# Patient Record
Sex: Female | Born: 1990 | Race: Black or African American | Hispanic: No | Marital: Single | Smoking: Former smoker
Health system: Southern US, Community
[De-identification: ages and names within clinical notes are randomized; demographics above are authoritative.]

## PROBLEM LIST (undated history)

## (undated) DIAGNOSIS — S82109A Unspecified fracture of upper end of unspecified tibia, initial encounter for closed fracture: Secondary | ICD-10-CM

## (undated) DIAGNOSIS — Z3490 Encounter for supervision of normal pregnancy, unspecified, unspecified trimester: Secondary | ICD-10-CM

## (undated) HISTORY — PX: MOUTH SURGERY: SHX715

---
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
## 2012-07-26 ENCOUNTER — Emergency Department (HOSPITAL_COMMUNITY)
Admission: EM | Admit: 2012-07-26 | Discharge: 2012-07-26 | Disposition: A | Discharge status: Home / Self Care | Payer: Medicaid Other | Attending: Emergency Medicine | Admitting: Emergency Medicine

## 2012-07-26 ENCOUNTER — Encounter (HOSPITAL_COMMUNITY): Payer: Self-pay | Admitting: Emergency Medicine

## 2012-07-26 DIAGNOSIS — K047 Periapical abscess without sinus: Secondary | ICD-10-CM

## 2012-07-26 DIAGNOSIS — F17200 Nicotine dependence, unspecified, uncomplicated: Secondary | ICD-10-CM | POA: Insufficient documentation

## 2012-07-26 DIAGNOSIS — K029 Dental caries, unspecified: Secondary | ICD-10-CM | POA: Insufficient documentation

## 2012-07-26 MED ORDER — AMOXICILLIN 500 MG PO CAPS: 500.0000 mg | ORAL_CAPSULE | Freq: Three times a day (TID) | ORAL | Status: DC

## 2012-07-26 MED ORDER — HYDROCODONE-ACETAMINOPHEN 5-325 MG PO TABS
1.0000 | ORAL_TABLET | Freq: Once | ORAL | Status: AC
  Administered 2012-07-26: 1 via ORAL
  Filled 2012-07-26: qty 1

## 2012-07-26 MED ORDER — HYDROCODONE-ACETAMINOPHEN 5-325 MG PO TABS: 1.0000 | ORAL_TABLET | ORAL | Status: AC | PRN

## 2012-07-26 MED ORDER — AMOXICILLIN 250 MG PO CAPS
500.0000 mg | ORAL_CAPSULE | Freq: Once | ORAL | Status: AC
  Administered 2012-07-26: 500 mg via ORAL
  Filled 2012-07-26: qty 2

## 2012-07-26 NOTE — ED Notes (Signed)
Pt c/o dental pain constantly x 2 weeks.

## 2012-07-26 NOTE — ED Provider Notes (Signed)
History     CSN: [id number]  Arrival date & time 07/26/12  1958   First MD Initiated Contact with Patient 07/26/12 2055      Chief Complaint  Patient presents with  . Dental Pain    (Consider location/radiation/quality/duration/timing/severity/associated sxs/prior treatment) HPI Comments: Brianna Maynard is a 22 y.o. Female presenting with a 10 day history of dental pain and gingival swelling.   The patient has a history of a cavity in the tooth involved which has recently started to cause pain.  There has been no fevers,  Chills, nausea or vomiting, also no complaint of difficulty swallowing,  Although chewing makes pain worse.  The patient has tried ibuprofen without relief of symptoms.         The history is provided by the patient.    History reviewed. No pertinent past medical history.  Past Surgical History  Procedure Date  . Mouth surgery     History reviewed. No pertinent family history.  History  Substance Use Topics  . Smoking status: Current Every Day Smoker    Types: Cigarettes  . Smokeless tobacco: Not on file  . Alcohol Use: No    OB History    Grav Para Term Preterm Abortions TAB SAB Ect Mult Living                  Review of Systems  Constitutional: Negative for fever.  HENT: Positive for dental problem. Negative for sore throat, facial swelling, neck pain and neck stiffness.   Respiratory: Negative for shortness of breath.     Allergies  Review of patient's allergies indicates no known allergies.  Home Medications   Current Outpatient Rx  Name  Route  Sig  Dispense  Refill  . IBUPROFEN 200 MG PO TABS   Oral   Take 600 mg by mouth once as needed. For pain         . AMOXICILLIN 500 MG PO CAPS   Oral   Take 1 capsule (500 mg total) by mouth 3 (three) times daily.   30 capsule   0   . HYDROCODONE-ACETAMINOPHEN 5-325 MG PO TABS   Oral   Take 1 tablet by mouth every 4 (four) hours as needed for pain.   12 tablet   0     BP  135/83  Pulse 93  Temp 97.9 F (36.6 C) (Oral)  Resp 18  Ht 5\' 5"  (1.651 m)  Wt 110 lb (49.896 kg)  BMI 18.31 kg/m2  SpO2 100%  LMP 07/23/2012  Physical Exam  Constitutional: She is oriented to person, place, and time. She appears well-developed and well-nourished. No distress.  HENT:  Head: Normocephalic and atraumatic. No trismus in the jaw.  Right Ear: Tympanic membrane and external ear normal.  Left Ear: Tympanic membrane and external ear normal.  Mouth/Throat: Oropharynx is clear and moist and mucous membranes are normal. No oral lesions. Dental abscesses present.    Eyes: Conjunctivae normal are normal.  Neck: Normal range of motion. Neck supple.  Cardiovascular: Normal rate and normal heart sounds.   Pulmonary/Chest: Effort normal.  Abdominal: She exhibits no distension.  Musculoskeletal: Normal range of motion.  Lymphadenopathy:    She has no cervical adenopathy.  Neurological: She is alert and oriented to person, place, and time.  Skin: Skin is warm and dry. No erythema.  Psychiatric: She has a normal mood and affect.    ED Course  Procedures (including critical care time)  Labs Reviewed - No data  to display No results found.   1. Dental abscess       MDM  Pt prescribed Amoxil and hydrocodone.  She will continue taking her ibuprofen.  She was given dental referral numbers, also discussed the dental clinic associated with the health department which she plans to explore.  Patient has no systemic symptoms.  She's not acutely ill.        Burgess Amor, [place] 07/26/12 2128

## 2012-07-27 NOTE — ED Provider Notes (Signed)
Medical screening examination/treatment/procedure(s) were performed by non-physician practitioner and as supervising physician I was immediately available for consultation/collaboration.  Stephen Kohut, MD 07/27/12 1450 

## 2012-11-22 ENCOUNTER — Encounter (HOSPITAL_COMMUNITY): Payer: Self-pay | Admitting: *Deleted

## 2012-11-22 ENCOUNTER — Emergency Department (HOSPITAL_COMMUNITY)
Admission: EM | Admit: 2012-11-22 | Discharge: 2012-11-23 | Disposition: A | Discharge status: Home / Self Care | Payer: Self-pay | Attending: Emergency Medicine | Admitting: Emergency Medicine

## 2012-11-22 DIAGNOSIS — Z8719 Personal history of other diseases of the digestive system: Secondary | ICD-10-CM

## 2012-11-22 DIAGNOSIS — N949 Unspecified condition associated with female genital organs and menstrual cycle: Secondary | ICD-10-CM | POA: Insufficient documentation

## 2012-11-22 DIAGNOSIS — R102 Pelvic and perineal pain: Secondary | ICD-10-CM

## 2012-11-22 DIAGNOSIS — G8929 Other chronic pain: Secondary | ICD-10-CM | POA: Insufficient documentation

## 2012-11-22 DIAGNOSIS — F17200 Nicotine dependence, unspecified, uncomplicated: Secondary | ICD-10-CM | POA: Insufficient documentation

## 2012-11-22 DIAGNOSIS — K5900 Constipation, unspecified: Secondary | ICD-10-CM | POA: Insufficient documentation

## 2012-11-22 DIAGNOSIS — Z3202 Encounter for pregnancy test, result negative: Secondary | ICD-10-CM | POA: Insufficient documentation

## 2012-11-22 DIAGNOSIS — N946 Dysmenorrhea, unspecified: Secondary | ICD-10-CM | POA: Insufficient documentation

## 2012-11-22 LAB — URINALYSIS, ROUTINE W REFLEX MICROSCOPIC
Bilirubin Urine: NEGATIVE
Glucose, UA: NEGATIVE mg/dL
Ketones, ur: NEGATIVE mg/dL
Leukocytes, UA: NEGATIVE
Nitrite: NEGATIVE
Protein, ur: NEGATIVE mg/dL
Specific Gravity, Urine: 1.02 (ref 1.005–1.030)
Urobilinogen, UA: 1 mg/dL (ref 0.0–1.0)
pH: 7 (ref 5.0–8.0)

## 2012-11-22 LAB — URINE MICROSCOPIC-ADD ON

## 2012-11-22 LAB — PREGNANCY, URINE: Preg Test, Ur: NEGATIVE

## 2012-11-22 MED ORDER — NAPROXEN 250 MG PO TABS
500.0000 mg | ORAL_TABLET | Freq: Once | ORAL | Status: AC
  Administered 2012-11-22: 500 mg via ORAL
  Filled 2012-11-22: qty 2

## 2012-11-22 NOTE — ED Provider Notes (Signed)
History     This chart was scribed for Jones Skene, MD, MD by Smitty Pluck, ED Scribe. The patient was seen in room APA18/[facility]18 and the patient's care was started at 1:19 PM.   CSN: [id number]  Arrival date & time 11/22/12  1851      No chief complaint on file.    The history is provided by the patient and medical records. No language interpreter was used.   HPI Comments: Brianna Maynard is a 22 y.o. female who presents to the Emergency Department complaining of intermittent, severe cramping abdominal pain onset 2 weeks ago. She rates the pain at 8/10. Pt reports that she has taken 3 ibuprofen today without relief of pain. She denies aggravation of pain after eating. She mentions having mild constipation.She states she has white vaginal discharge without odor. LNMP was beginning of May. She mentions having swollen area over left eye that has been present for 2 months. Pt states she has tried OTC sty relief ointment without relief.  Pt denies dysuria, urinary urgency, CP, fever, chills, nausea, vomiting, diarrhea, weakness, cough, SOB and any other pain. She denies new sexual partners within last 6 months.    History reviewed. No pertinent past medical history.  Past Surgical History  Procedure Laterality Date  . Mouth surgery      History reviewed. No pertinent family history.  History  Substance Use Topics  . Smoking status: Current Every Day Smoker -- 1.00 packs/day    Types: Cigarettes  . Smokeless tobacco: Not on file  . Alcohol Use: No    OB History   Grav Para Term Preterm Abortions TAB SAB Ect Mult Living                  Review of Systems At least 10pt or greater review of systems completed and are negative except where specified in the HPI.  Allergies  Review of patient's allergies indicates no known allergies.  Home Medications   Current Outpatient Rx  Name  Route  Sig  Dispense  Refill  . ibuprofen (ADVIL,MOTRIN) 200 MG tablet   Oral   Take 600 mg  by mouth every 8 (eight) hours as needed for pain. For pain           BP 126/89  Pulse 82  Temp(Src) 98.4 F (36.9 C) (Oral)  Resp 16  Ht 5\' 5"  (1.651 m)  Wt 116 lb (52.617 kg)  BMI 19.3 kg/m2  SpO2 100%  LMP 10/22/2012  Physical Exam  Nursing note and vitals reviewed.   Nursing notes reviewed.  Electronic medical record reviewed. VITAL SIGNS:   Filed Vitals:   11/22/12 1923 11/23/12 0041  BP: 126/89 119/70  Pulse: 82 71  Temp: 98.4 F (36.9 C)   TempSrc: Oral   Resp: 16 16  Height: 5\' 5"  (1.651 m)   Weight: 116 lb (52.617 kg)   SpO2: 100% 98%   CONSTITUTIONAL: Awake, oriented, appears non-toxic HENT: Atraumatic, normocephalic, oral mucosa pink and moist, airway patent. Nares patent without drainage. External ears normal. EYES: Conjunctiva clear, EOMI, PERRLA. Left lateral eyelid stye NECK: Trachea midline, non-tender, supple CARDIOVASCULAR: Normal heart rate, Normal rhythm, No murmurs, rubs, gallops PULMONARY/CHEST: Clear to auscultation, no rhonchi, wheezes, or rales. Symmetrical breath sounds. Non-tender. ABDOMINAL: Non-distended, soft, non-tender - no rebound or guarding.  BS normal. NEUROLOGIC: Non-focal, moving all four extremities, no gross sensory or motor deficits. EXTREMITIES: No clubbing, cyanosis, or edema SKIN: Warm, Dry, No erythema, No rash   ED  Course  Procedures (including critical care time) DIAGNOSTIC STUDIES: Oxygen Saturation is 100% on room air, normal by my interpretation.    COORDINATION OF CARE: 11:22 PM Discussed ED treatment with pt and pt agrees.  Medications  naproxen (NAPROSYN) tablet 500 mg (not administered)    Labs Reviewed  WET PREP, GENITAL - Abnormal; Notable for the following:    Clue Cells Wet Prep HPF POC FEW (*)    WBC, Wet Prep HPF POC FEW (*)    All other components within normal limits  URINALYSIS, ROUTINE W REFLEX MICROSCOPIC - Abnormal; Notable for the following:    Hgb urine dipstick MODERATE (*)    All  other components within normal limits  URINE MICROSCOPIC-ADD ON - Abnormal; Notable for the following:    Squamous Epithelial / LPF FEW (*)    All other components within normal limits  GC/CHLAMYDIA PROBE AMP  PREGNANCY, URINE   Dg Abd 2 Views  11/23/2012   *RADIOLOGY REPORT*  Clinical Data: Lower abdominal pain.  ABDOMEN - 2 VIEW  Comparison: No priors.  Findings: Gas and stool are seen scattered throughout the colon extending to the level of the distal rectum.  No pathologic distension of small bowel is noted.  No gross evidence of pneumoperitoneum.  IMPRESSION: 1.  Nonobstructive bowel gas pattern. 2.  No pneumoperitoneum.   Original Report Authenticated By: Trudie Reed, M.D.     1. Pelvic pain   2. H/O constipation     Medications  naproxen (NAPROSYN) tablet 500 mg (500 mg Oral Given 11/22/12 2327)     MDM  Brianna Maynard is a 22 y.o. female presents with chronic intermittent abdominal cramping for 2-3 weeks, occasional constipation - pain resolved with naproxen. No evidence or suggestion of acute intra-abdominal or pelvic pathology based on history and physical exam, patient placed on a bowel regimen to help with her constipation. Her pain may be coming from this. Patient also has a stye, she's instructed to place warm compresses on it    I personally performed the services described in this documentation, which was scribed in my presence. The recorded information has been reviewed and is accurate. Jones Skene, M.D.     Jones Skene, MD 11/24/12 [phone]

## 2012-11-22 NOTE — ED Notes (Signed)
Pt reporting pain and swollen area on left eye lid.  Reporting intermittent abdominal cramping for 2-3 weeks.  Reporting occasional constipation.  Denies nausea or vomiting.  Denies any urgency or frequency with urination.

## 2012-11-22 NOTE — ED Notes (Signed)
MD at bedside. 

## 2012-11-23 ENCOUNTER — Emergency Department (HOSPITAL_COMMUNITY): Payer: Self-pay

## 2012-11-23 LAB — WET PREP, GENITAL
Trich, Wet Prep: NONE SEEN
Yeast Wet Prep HPF POC: NONE SEEN

## 2012-11-23 MED ORDER — POLYETHYLENE GLYCOL 3350 17 GM/SCOOP PO POWD: 17.0000 g | Freq: Two times a day (BID) | ORAL | Status: DC

## 2012-11-23 MED ORDER — DOCUSATE SODIUM 100 MG PO CAPS: 100.0000 mg | ORAL_CAPSULE | Freq: Two times a day (BID) | ORAL | Status: DC

## 2012-11-24 LAB — GC/CHLAMYDIA PROBE AMP
CT Probe RNA: NEGATIVE
GC Probe RNA: NEGATIVE

## 2013-01-09 ENCOUNTER — Encounter (HOSPITAL_COMMUNITY): Payer: Self-pay | Admitting: Emergency Medicine

## 2013-01-09 ENCOUNTER — Emergency Department (HOSPITAL_COMMUNITY)
Admission: EM | Admit: 2013-01-09 | Discharge: 2013-01-09 | Disposition: A | Discharge status: Home / Self Care | Payer: Self-pay | Attending: Emergency Medicine | Admitting: Emergency Medicine

## 2013-01-09 DIAGNOSIS — Z9889 Other specified postprocedural states: Secondary | ICD-10-CM | POA: Insufficient documentation

## 2013-01-09 DIAGNOSIS — K0889 Other specified disorders of teeth and supporting structures: Secondary | ICD-10-CM

## 2013-01-09 DIAGNOSIS — K029 Dental caries, unspecified: Secondary | ICD-10-CM | POA: Insufficient documentation

## 2013-01-09 DIAGNOSIS — F17200 Nicotine dependence, unspecified, uncomplicated: Secondary | ICD-10-CM | POA: Insufficient documentation

## 2013-01-09 DIAGNOSIS — K089 Disorder of teeth and supporting structures, unspecified: Secondary | ICD-10-CM | POA: Insufficient documentation

## 2013-01-09 MED ORDER — OXYCODONE-ACETAMINOPHEN 5-325 MG PO TABS
2.0000 | ORAL_TABLET | Freq: Once | ORAL | Status: AC
  Administered 2013-01-09: 2 via ORAL
  Filled 2013-01-09: qty 2

## 2013-01-09 NOTE — ED Notes (Signed)
Patient c/o left lower tooth pain x 1 week.  Patient states she has appointment with oral surgeon tomorrow for removal, but the pain has increased.

## 2013-01-09 NOTE — ED Notes (Signed)
Discharge instructions given and reviewed with patient and her mother.  Mother verbalized understanding to follow up with oral surgeon today for further pain management.  Patient ambulatory; discharged home in good condition.

## 2013-01-09 NOTE — ED Provider Notes (Signed)
   History    CSN: [id number] Arrival date & time 01/09/13  0530  First MD Initiated Contact with Patient 01/09/13 0536     Chief Complaint  Patient presents with  . Dental Pain   Patient is a 22 y.o. female presenting with tooth pain. The history is provided by the patient and a relative.  Dental Pain Location:  Lower Quality:  Dull Severity:  Severe Onset quality:  Gradual Timing:  Constant Progression:  Worsening Worsened by:  Jaw movement Associated symptoms: no fever     PMH - none  Past Surgical History  Procedure Laterality Date  . Mouth surgery     No family history on file. History  Substance Use Topics  . Smoking status: Current Every Day Smoker -- 1.00 packs/day    Types: Cigarettes  . Smokeless tobacco: Not on file  . Alcohol Use: No   OB History   Grav Para Term Preterm Abortions TAB SAB Ect Mult Living                 Review of Systems  Constitutional: Negative for fever.  Gastrointestinal: Negative for vomiting.    Allergies  Review of patient's allergies indicates no known allergies.  Home Medications   Current Outpatient Rx  Name  Route  Sig  Dispense  Refill  . penicillin v potassium (VEETID) 500 MG tablet   Oral   Take 500 mg by mouth 4 (four) times daily.         . traMADol (ULTRAM) 50 MG tablet   Oral   Take 50 mg by mouth every 6 (six) hours as needed for pain.         Marland Kitchen docusate sodium (COLACE) 100 MG capsule   Oral   Take 1 capsule (100 mg total) by mouth every 12 (twelve) hours.   60 capsule   0   . ibuprofen (ADVIL,MOTRIN) 200 MG tablet   Oral   Take 600 mg by mouth every 8 (eight) hours as needed for pain. For pain         . polyethylene glycol powder (GLYCOLAX) powder   Oral   Take 17 g by mouth 2 (two) times daily.   255 g   0    BP 133/98  Pulse 70  Temp(Src) 99.1 F (37.3 C) (Oral)  Resp 18  Ht 5\' 4"  (1.626 m)  Wt 115 lb (52.164 kg)  BMI 19.73 kg/m2  SpO2 100% Physical Exam CONSTITUTIONAL: Well  developed/well nourished HEAD AND FACE: Normocephalic/atraumatic EYES: EOMI ENMT: Mucous membranes moist.  Poor dentition.  No trismus.  No focal abscess noted. Left lower molar decayed and tender NECK: supple no meningeal signs CV: S1/S2 noted, no murmurs/rubs/gallops noted LUNGS: Lungs are clear to auscultation bilaterally, no apparent distress ABDOMEN: soft, nontender, no rebound or guarding NEURO: Pt is awake/alert, moves all extremitiesx4 EXTREMITIES:full ROM SKIN: warm, color normal  ED Course  Procedures (including critical care time) Labs Reviewed - No data to display No results found. 1. Pain, dental     MDM  Nursing notes including past medical history and social history reviewed and considered in documentation  Pt is scheduled for dental extraction tomorrow She is on PCN and tramadol/ibuprofen.  She reports they are not helping pain Two percocet ordered here.   I advised her to call her oral surgeon today for further pain control Pt here with family to take her home  Joya Gaskins, MD 01/09/13 [phone]

## 2013-06-22 DIAGNOSIS — W3400XA Accidental discharge from unspecified firearms or gun, initial encounter: Secondary | ICD-10-CM

## 2013-06-22 HISTORY — DX: Accidental discharge from unspecified firearms or gun, initial encounter: W34.00XA

## 2013-07-02 ENCOUNTER — Inpatient Hospital Stay: Admit: 2013-07-02 | Payer: Medicaid Other | Admitting: Orthopedic Surgery

## 2013-07-02 ENCOUNTER — Emergency Department (HOSPITAL_COMMUNITY): Payer: Medicaid Other

## 2013-07-02 ENCOUNTER — Encounter (HOSPITAL_COMMUNITY): Payer: Self-pay | Admitting: Anesthesiology

## 2013-07-02 ENCOUNTER — Inpatient Hospital Stay (HOSPITAL_COMMUNITY): Payer: Medicaid Other

## 2013-07-02 ENCOUNTER — Inpatient Hospital Stay (HOSPITAL_COMMUNITY)
Admission: EM | Admit: 2013-07-02 | Discharge: 2013-07-05 | DRG: 493 | Disposition: A | Discharge status: Home / Self Care | Payer: Medicaid Other | Attending: General Surgery | Admitting: General Surgery

## 2013-07-02 ENCOUNTER — Encounter (HOSPITAL_COMMUNITY): Payer: Self-pay | Admitting: Emergency Medicine

## 2013-07-02 ENCOUNTER — Other Ambulatory Visit (HOSPITAL_COMMUNITY): Payer: Self-pay

## 2013-07-02 ENCOUNTER — Inpatient Hospital Stay (HOSPITAL_COMMUNITY): Payer: Medicaid Other | Admitting: Anesthesiology

## 2013-07-02 ENCOUNTER — Encounter (HOSPITAL_COMMUNITY): Admission: EM | Disposition: A | Discharge status: Home / Self Care | Payer: Self-pay | Source: Home / Self Care

## 2013-07-02 DIAGNOSIS — S82201A Unspecified fracture of shaft of right tibia, initial encounter for closed fracture: Secondary | ICD-10-CM

## 2013-07-02 DIAGNOSIS — S91009A Unspecified open wound, unspecified ankle, initial encounter: Secondary | ICD-10-CM

## 2013-07-02 DIAGNOSIS — F17200 Nicotine dependence, unspecified, uncomplicated: Secondary | ICD-10-CM | POA: Diagnosis present

## 2013-07-02 DIAGNOSIS — S82109B Unspecified fracture of upper end of unspecified tibia, initial encounter for open fracture type I or II: Principal | ICD-10-CM | POA: Diagnosis present

## 2013-07-02 DIAGNOSIS — IMO0002 Reserved for concepts with insufficient information to code with codable children: Secondary | ICD-10-CM | POA: Diagnosis present

## 2013-07-02 DIAGNOSIS — S32301A Unspecified fracture of right ilium, initial encounter for closed fracture: Secondary | ICD-10-CM | POA: Diagnosis present

## 2013-07-02 DIAGNOSIS — S82101B Unspecified fracture of upper end of right tibia, initial encounter for open fracture type I or II: Secondary | ICD-10-CM

## 2013-07-02 DIAGNOSIS — S31809A Unspecified open wound of unspecified buttock, initial encounter: Secondary | ICD-10-CM

## 2013-07-02 DIAGNOSIS — S82109A Unspecified fracture of upper end of unspecified tibia, initial encounter for closed fracture: Secondary | ICD-10-CM

## 2013-07-02 DIAGNOSIS — W3400XA Accidental discharge from unspecified firearms or gun, initial encounter: Secondary | ICD-10-CM

## 2013-07-02 DIAGNOSIS — Y249XXA Unspecified firearm discharge, undetermined intent, initial encounter: Secondary | ICD-10-CM

## 2013-07-02 DIAGNOSIS — S82209A Unspecified fracture of shaft of unspecified tibia, initial encounter for closed fracture: Secondary | ICD-10-CM

## 2013-07-02 DIAGNOSIS — S31813A Puncture wound without foreign body of right buttock, initial encounter: Secondary | ICD-10-CM

## 2013-07-02 DIAGNOSIS — D62 Acute posthemorrhagic anemia: Secondary | ICD-10-CM | POA: Diagnosis not present

## 2013-07-02 DIAGNOSIS — S81831A Puncture wound without foreign body, right lower leg, initial encounter: Secondary | ICD-10-CM

## 2013-07-02 DIAGNOSIS — S81009A Unspecified open wound, unspecified knee, initial encounter: Secondary | ICD-10-CM

## 2013-07-02 DIAGNOSIS — S81809A Unspecified open wound, unspecified lower leg, initial encounter: Secondary | ICD-10-CM

## 2013-07-02 DIAGNOSIS — S0081XA Abrasion of other part of head, initial encounter: Secondary | ICD-10-CM

## 2013-07-02 DIAGNOSIS — Z1810 Retained metal fragments, unspecified: Secondary | ICD-10-CM

## 2013-07-02 HISTORY — DX: Unspecified fracture of upper end of unspecified tibia, initial encounter for closed fracture: S82.109A

## 2013-07-02 HISTORY — PX: TIBIA IM NAIL INSERTION: SHX2516

## 2013-07-02 HISTORY — PX: FOREIGN BODY REMOVAL: SHX962

## 2013-07-02 LAB — URINALYSIS, ROUTINE W REFLEX MICROSCOPIC
Bilirubin Urine: NEGATIVE
Glucose, UA: NEGATIVE mg/dL
Ketones, ur: NEGATIVE mg/dL
Leukocytes, UA: NEGATIVE
Nitrite: NEGATIVE
Protein, ur: NEGATIVE mg/dL
Specific Gravity, Urine: 1.037 — ABNORMAL HIGH (ref 1.005–1.030)
Urobilinogen, UA: 1 mg/dL (ref 0.0–1.0)
pH: 6 (ref 5.0–8.0)

## 2013-07-02 LAB — BASIC METABOLIC PANEL
BUN: 9 mg/dL (ref 6–23)
CO2: 24 mEq/L (ref 19–32)
Calcium: 8 mg/dL — ABNORMAL LOW (ref 8.4–10.5)
Chloride: 105 mEq/L (ref 96–112)
Creatinine, Ser: 0.52 mg/dL (ref 0.50–1.10)
GFR calc Af Amer: >90 mL/min (ref >90)
GFR calc non Af Amer: >90 mL/min (ref >90)
Glucose, Bld: 110 mg/dL — ABNORMAL HIGH (ref 70–99)
Potassium: 3.5 mEq/L — ABNORMAL LOW (ref 3.7–5.3)
Sodium: 142 mEq/L (ref 137–147)

## 2013-07-02 LAB — CBC WITH DIFFERENTIAL/PLATELET
Basophils Absolute: 0 10*3/uL (ref 0.0–0.1)
Basophils Relative: 0 % (ref 0–1)
Eosinophils Absolute: 0.1 10*3/uL (ref 0.0–0.7)
Eosinophils Relative: 1 % (ref 0–5)
HCT: 33 % — ABNORMAL LOW (ref 36.0–46.0)
Hemoglobin: 11.1 g/dL — ABNORMAL LOW (ref 12.0–15.0)
Lymphocytes Relative: 30 % (ref 12–46)
Lymphs Abs: 3.2 10*3/uL (ref 0.7–4.0)
MCH: 27.8 pg (ref 26.0–34.0)
MCHC: 33.6 g/dL (ref 30.0–36.0)
MCV: 82.7 fL (ref 78.0–100.0)
Monocytes Absolute: 0.4 10*3/uL (ref 0.1–1.0)
Monocytes Relative: 4 % (ref 3–12)
Neutro Abs: 6.8 10*3/uL (ref 1.7–7.7)
Neutrophils Relative %: 64 % (ref 43–77)
Platelets: 177 10*3/uL (ref 150–400)
RBC: 3.99 MIL/uL (ref 3.87–5.11)
RDW: 13.8 % (ref 11.5–15.5)
WBC: 10.5 10*3/uL (ref 4.0–10.5)

## 2013-07-02 LAB — SAMPLE TO BLOOD BANK

## 2013-07-02 LAB — URINE MICROSCOPIC-ADD ON

## 2013-07-02 LAB — PREGNANCY, URINE: Preg Test, Ur: NEGATIVE

## 2013-07-02 SURGERY — INSERTION, INTRAMEDULLARY ROD, TIBIA
Anesthesia: General | Site: Pelvis | Laterality: Right

## 2013-07-02 MED ORDER — NEOSTIGMINE METHYLSULFATE 1 MG/ML IJ SOLN
INTRAMUSCULAR | Status: DC | PRN
  Administered 2013-07-02: 3 mg via INTRAVENOUS

## 2013-07-02 MED ORDER — OXYCODONE HCL 5 MG/5ML PO SOLN: 5.0000 mg | Freq: Once | ORAL | Status: DC | PRN

## 2013-07-02 MED ORDER — DOCUSATE SODIUM 100 MG PO CAPS
100.0000 mg | ORAL_CAPSULE | Freq: Two times a day (BID) | ORAL | Status: DC
  Administered 2013-07-02 – 2013-07-05 (×6): 100 mg via ORAL
  Filled 2013-07-02 (×7): qty 1

## 2013-07-02 MED ORDER — CEFAZOLIN SODIUM 1-5 GM-% IV SOLN
1.0000 g | Freq: Three times a day (TID) | INTRAVENOUS | Status: DC
  Filled 2013-07-02: qty 50

## 2013-07-02 MED ORDER — IOHEXOL 300 MG/ML  SOLN
100.0000 mL | Freq: Once | INTRAMUSCULAR | Status: AC | PRN
  Administered 2013-07-02: 100 mL via INTRAVENOUS

## 2013-07-02 MED ORDER — CEFAZOLIN SODIUM 1-5 GM-% IV SOLN: 1.0000 g | INTRAVENOUS | Status: DC

## 2013-07-02 MED ORDER — SENNA 8.6 MG PO TABS
1.0000 | ORAL_TABLET | Freq: Two times a day (BID) | ORAL | Status: DC
  Administered 2013-07-02 – 2013-07-05 (×6): 8.6 mg via ORAL
  Filled 2013-07-02 (×8): qty 1

## 2013-07-02 MED ORDER — POLYETHYLENE GLYCOL 3350 17 G PO PACK
17.0000 g | PACK | Freq: Every day | ORAL | Status: DC | PRN
  Filled 2013-07-02: qty 1

## 2013-07-02 MED ORDER — PROMETHAZINE HCL 25 MG/ML IJ SOLN: 6.2500 mg | INTRAMUSCULAR | Status: DC | PRN

## 2013-07-02 MED ORDER — BUPIVACAINE HCL (PF) 0.25 % IJ SOLN
INTRAMUSCULAR | Status: DC | PRN
  Administered 2013-07-02: 20 mL

## 2013-07-02 MED ORDER — PANTOPRAZOLE SODIUM 40 MG IV SOLR
40.0000 mg | Freq: Every day | INTRAVENOUS | Status: DC
  Administered 2013-07-02: 40 mg via INTRAVENOUS
  Filled 2013-07-02 (×2): qty 40

## 2013-07-02 MED ORDER — ONDANSETRON HCL 4 MG/2ML IJ SOLN
INTRAMUSCULAR | Status: DC | PRN
  Administered 2013-07-02: 4 mg via INTRAVENOUS

## 2013-07-02 MED ORDER — DIPHENHYDRAMINE HCL 12.5 MG/5ML PO ELIX
12.5000 mg | ORAL_SOLUTION | ORAL | Status: DC | PRN
  Filled 2013-07-02: qty 10

## 2013-07-02 MED ORDER — METHOCARBAMOL 500 MG PO TABS
ORAL_TABLET | ORAL | Status: AC
  Administered 2013-07-02: 500 mg via ORAL
  Filled 2013-07-02: qty 1

## 2013-07-02 MED ORDER — ROCURONIUM BROMIDE 100 MG/10ML IV SOLN
INTRAVENOUS | Status: DC | PRN
  Administered 2013-07-02: 40 mg via INTRAVENOUS

## 2013-07-02 MED ORDER — 0.9 % SODIUM CHLORIDE (POUR BTL) OPTIME
TOPICAL | Status: DC | PRN
  Administered 2013-07-02: 1000 mL

## 2013-07-02 MED ORDER — ARTIFICIAL TEARS OP OINT
TOPICAL_OINTMENT | OPHTHALMIC | Status: DC | PRN
  Administered 2013-07-02: 1 via OPHTHALMIC

## 2013-07-02 MED ORDER — HYDROMORPHONE HCL PF 1 MG/ML IJ SOLN
INTRAMUSCULAR | Status: AC
  Administered 2013-07-02: 0.5 mg via INTRAVENOUS
  Filled 2013-07-02: qty 1

## 2013-07-02 MED ORDER — SUCCINYLCHOLINE CHLORIDE 20 MG/ML IJ SOLN
INTRAMUSCULAR | Status: DC | PRN
  Administered 2013-07-02: 100 mg via INTRAVENOUS

## 2013-07-02 MED ORDER — GLYCOPYRROLATE 0.2 MG/ML IJ SOLN
INTRAMUSCULAR | Status: DC | PRN
  Administered 2013-07-02: 0.4 mg via INTRAVENOUS

## 2013-07-02 MED ORDER — FENTANYL CITRATE 0.05 MG/ML IJ SOLN
INTRAMUSCULAR | Status: DC | PRN
  Administered 2013-07-02: 100 ug via INTRAVENOUS
  Administered 2013-07-02 (×4): 50 ug via INTRAVENOUS

## 2013-07-02 MED ORDER — PROPOFOL 10 MG/ML IV BOLUS
INTRAVENOUS | Status: DC | PRN
  Administered 2013-07-02: 150 mg via INTRAVENOUS

## 2013-07-02 MED ORDER — CEFAZOLIN SODIUM 1-5 GM-% IV SOLN
1.0000 g | Freq: Four times a day (QID) | INTRAVENOUS | Status: AC
  Administered 2013-07-02 – 2013-07-03 (×3): 1 g via INTRAVENOUS
  Filled 2013-07-02 (×4): qty 50

## 2013-07-02 MED ORDER — DEXTROSE 5 % IV SOLN
INTRAVENOUS | Status: DC | PRN
  Administered 2013-07-02: 16:00:00 via INTRAVENOUS

## 2013-07-02 MED ORDER — HYDROMORPHONE HCL PF 1 MG/ML IJ SOLN
0.2500 mg | INTRAMUSCULAR | Status: DC | PRN
Start: 2013-07-02 — End: 2013-07-02
  Administered 2013-07-02 (×4): 0.5 mg via INTRAVENOUS

## 2013-07-02 MED ORDER — METHOCARBAMOL 100 MG/ML IJ SOLN
500.0000 mg | Freq: Four times a day (QID) | INTRAMUSCULAR | Status: DC | PRN
  Filled 2013-07-02: qty 5

## 2013-07-02 MED ORDER — HYDROMORPHONE HCL PF 1 MG/ML IJ SOLN
1.0000 mg | INTRAMUSCULAR | Status: DC | PRN
  Administered 2013-07-02 – 2013-07-03 (×4): 1 mg via INTRAVENOUS
  Filled 2013-07-02 (×3): qty 1

## 2013-07-02 MED ORDER — HYDROMORPHONE HCL PF 1 MG/ML IJ SOLN
1.0000 mg | INTRAMUSCULAR | Status: DC | PRN
  Filled 2013-07-02 (×3): qty 1

## 2013-07-02 MED ORDER — LIDOCAINE HCL (CARDIAC) 20 MG/ML IV SOLN
INTRAVENOUS | Status: DC | PRN
  Administered 2013-07-02: 80 mg via INTRAVENOUS

## 2013-07-02 MED ORDER — OXYCODONE-ACETAMINOPHEN 5-325 MG PO TABS
ORAL_TABLET | ORAL | Status: AC
  Administered 2013-07-02: 2 via ORAL
  Filled 2013-07-02: qty 2

## 2013-07-02 MED ORDER — CEFAZOLIN SODIUM 1-5 GM-% IV SOLN
1.0000 g | Freq: Once | INTRAVENOUS | Status: AC
  Administered 2013-07-02: 1 g via INTRAVENOUS
  Filled 2013-07-02: qty 50

## 2013-07-02 MED ORDER — ZOLPIDEM TARTRATE 5 MG PO TABS: 5.0000 mg | ORAL_TABLET | Freq: Every evening | ORAL | Status: DC | PRN

## 2013-07-02 MED ORDER — POTASSIUM CHLORIDE IN NACL 20-0.9 MEQ/L-% IV SOLN
INTRAVENOUS | Status: DC
  Administered 2013-07-02: 90 mL/h via INTRAVENOUS
  Administered 2013-07-02: 23:00:00 via INTRAVENOUS
  Filled 2013-07-02 (×5): qty 1000

## 2013-07-02 MED ORDER — OXYCODONE-ACETAMINOPHEN 5-325 MG PO TABS
1.0000 | ORAL_TABLET | ORAL | Status: DC | PRN
  Administered 2013-07-02 – 2013-07-03 (×3): 2 via ORAL
  Filled 2013-07-02: qty 2
  Filled 2013-07-02 (×2): qty 1

## 2013-07-02 MED ORDER — ENOXAPARIN SODIUM 40 MG/0.4ML ~~LOC~~ SOLN
40.0000 mg | SUBCUTANEOUS | Status: DC
  Administered 2013-07-03 – 2013-07-04 (×3): 40 mg via SUBCUTANEOUS
  Filled 2013-07-02 (×6): qty 0.4

## 2013-07-02 MED ORDER — ONDANSETRON HCL 4 MG/2ML IJ SOLN: 4.0000 mg | Freq: Four times a day (QID) | INTRAMUSCULAR | Status: DC | PRN

## 2013-07-02 MED ORDER — ONDANSETRON HCL 4 MG PO TABS: 4.0000 mg | ORAL_TABLET | Freq: Four times a day (QID) | ORAL | Status: DC | PRN

## 2013-07-02 MED ORDER — LACTATED RINGERS IV SOLN
INTRAVENOUS | Status: DC | PRN
  Administered 2013-07-02 (×2): via INTRAVENOUS

## 2013-07-02 MED ORDER — METHOCARBAMOL 500 MG PO TABS
500.0000 mg | ORAL_TABLET | Freq: Four times a day (QID) | ORAL | Status: DC | PRN
  Administered 2013-07-02 – 2013-07-05 (×8): 500 mg via ORAL
  Filled 2013-07-02 (×8): qty 1

## 2013-07-02 MED ORDER — PANTOPRAZOLE SODIUM 40 MG PO TBEC: 40.0000 mg | DELAYED_RELEASE_TABLET | Freq: Every day | ORAL | Status: DC

## 2013-07-02 MED ORDER — OXYCODONE HCL 5 MG PO TABS: 5.0000 mg | ORAL_TABLET | Freq: Once | ORAL | Status: DC | PRN

## 2013-07-02 MED ORDER — HYDROMORPHONE HCL PF 1 MG/ML IJ SOLN
INTRAMUSCULAR | Status: AC
  Filled 2013-07-02: qty 1

## 2013-07-02 MED ORDER — CEFAZOLIN SODIUM-DEXTROSE 2-3 GM-% IV SOLR
INTRAVENOUS | Status: AC
  Administered 2013-07-02: 2 g via INTRAVENOUS
  Filled 2013-07-02: qty 50

## 2013-07-02 MED ORDER — HYDROMORPHONE HCL PF 1 MG/ML IJ SOLN
INTRAMUSCULAR | Status: DC | PRN
  Administered 2013-07-02: 1 mg

## 2013-07-02 MED ORDER — HYDROMORPHONE HCL PF 1 MG/ML IJ SOLN
1.0000 mg | Freq: Once | INTRAMUSCULAR | Status: AC
  Administered 2013-07-02: 1 mg via INTRAVENOUS

## 2013-07-02 MED ORDER — BISACODYL 10 MG RE SUPP
10.0000 mg | Freq: Every day | RECTAL | Status: DC | PRN
Start: 2013-07-02 — End: 2013-07-05

## 2013-07-02 MED ORDER — OXYCODONE HCL 5 MG PO TABS: 5.0000 mg | ORAL_TABLET | ORAL | Status: DC | PRN

## 2013-07-02 MED ORDER — MIDAZOLAM HCL 5 MG/5ML IJ SOLN
INTRAMUSCULAR | Status: DC | PRN
  Administered 2013-07-02: 2 mg via INTRAVENOUS

## 2013-07-02 SURGICAL SUPPLY — 66 items
BANDAGE ELASTIC 4 VELCRO ST LF (GAUZE/BANDAGES/DRESSINGS) IMPLANT
BANDAGE ELASTIC 6 VELCRO ST LF (GAUZE/BANDAGES/DRESSINGS) IMPLANT
BANDAGE ESMARK 6X9 LF (GAUZE/BANDAGES/DRESSINGS) ×2 IMPLANT
BANDAGE GAUZE ELAST BULKY 4 IN (GAUZE/BANDAGES/DRESSINGS) IMPLANT
BENZOIN TINCTURE PRP APPL 2/3 (GAUZE/BANDAGES/DRESSINGS) ×3 IMPLANT
BLADE SURG 15 STRL LF DISP TIS (BLADE) ×2 IMPLANT
BLADE SURG 15 STRL SS (BLADE) ×1
BLADE SURG ROTATE 9660 (MISCELLANEOUS) IMPLANT
BNDG COHESIVE 6X5 TAN STRL LF (GAUZE/BANDAGES/DRESSINGS) ×3 IMPLANT
BNDG ESMARK 6X9 LF (GAUZE/BANDAGES/DRESSINGS) ×3
BNDG GAUZE ELAST 4 BULKY (GAUZE/BANDAGES/DRESSINGS) ×3 IMPLANT
BOOTCOVER CLEANROOM LRG (PROTECTIVE WEAR) IMPLANT
CLOTH BEACON ORANGE TIMEOUT ST (SAFETY) IMPLANT
CONT SPEC 4OZ CLIKSEAL STRL BL (MISCELLANEOUS) ×6 IMPLANT
COVER SURGICAL LIGHT HANDLE (MISCELLANEOUS) ×3 IMPLANT
CUFF TOURNIQUET SINGLE 34IN LL (TOURNIQUET CUFF) ×3 IMPLANT
CUFF TOURNIQUET SINGLE 44IN (TOURNIQUET CUFF) IMPLANT
DRAPE C-ARM 42X72 X-RAY (DRAPES) ×3 IMPLANT
DRAPE ORTHO SPLIT 77X108 STRL (DRAPES) ×3
DRAPE PROXIMA HALF (DRAPES) ×3 IMPLANT
DRAPE SURG ORHT 6 SPLT 77X108 (DRAPES) ×6 IMPLANT
DRAPE U-SHAPE 47X51 STRL (DRAPES) ×3 IMPLANT
DRSG ADAPTIC 3X8 NADH LF (GAUZE/BANDAGES/DRESSINGS) ×3 IMPLANT
DRSG MEPILEX BORDER 4X4 (GAUZE/BANDAGES/DRESSINGS) ×3 IMPLANT
DRSG PAD ABDOMINAL 8X10 ST (GAUZE/BANDAGES/DRESSINGS) ×9 IMPLANT
DURAPREP 26ML APPLICATOR (WOUND CARE) ×3 IMPLANT
ELECT REM PT RETURN 9FT ADLT (ELECTROSURGICAL) ×3
ELECTRODE REM PT RTRN 9FT ADLT (ELECTROSURGICAL) ×2 IMPLANT
FACESHIELD LNG OPTICON STERILE (SAFETY) IMPLANT
GLOVE BIOGEL PI ORTHO PRO SZ8 (GLOVE) ×1
GLOVE ORTHO TXT STRL SZ7.5 (GLOVE) ×3 IMPLANT
GLOVE PI ORTHO PRO STRL SZ8 (GLOVE) ×2 IMPLANT
GLOVE SURG ORTHO 8.0 STRL STRW (GLOVE) ×9 IMPLANT
GOWN STRL NON-REIN LRG LVL3 (GOWN DISPOSABLE) ×9 IMPLANT
GUIDEWIRE BALL NOSE 80CM (WIRE) ×3 IMPLANT
IMMOBILIZER KNEE 24 THIGH 36 (MISCELLANEOUS) ×2 IMPLANT
IMMOBILIZER KNEE 24 UNIV (MISCELLANEOUS) ×3
KIT BASIN OR (CUSTOM PROCEDURE TRAY) ×3 IMPLANT
KIT ROOM TURNOVER OR (KITS) ×3 IMPLANT
MANIFOLD NEPTUNE II (INSTRUMENTS) ×3 IMPLANT
NAIL TIBIAL 9MMX34.5CM (Nail) ×3 IMPLANT
NDL SAFETY ECLIPSE 18X1.5 (NEEDLE) ×2 IMPLANT
NEEDLE HYPO 18GX1.5 SHARP (NEEDLE) ×1
NEEDLE HYPO 25GX1X1/2 BEV (NEEDLE) ×3 IMPLANT
NS IRRIG 1000ML POUR BTL (IV SOLUTION) ×3 IMPLANT
PACK GENERAL/GYN (CUSTOM PROCEDURE TRAY) ×3 IMPLANT
PAD ARMBOARD 7.5X6 YLW CONV (MISCELLANEOUS) ×6 IMPLANT
SPONGE GAUZE 4X4 12PLY (GAUZE/BANDAGES/DRESSINGS) ×6 IMPLANT
STAPLER VISISTAT 35W (STAPLE) ×3 IMPLANT
STOCKINETTE IMPERVIOUS LG (DRAPES) ×3 IMPLANT
STRIP CLOSURE SKIN 1/2X4 (GAUZE/BANDAGES/DRESSINGS) ×3 IMPLANT
SUT ETHILON 3 0 PS 1 (SUTURE) ×3 IMPLANT
SUT MNCRL AB 4-0 PS2 18 (SUTURE) ×3 IMPLANT
SUT VIC AB 0 CT1 18XCR BRD 8 (SUTURE) IMPLANT
SUT VIC AB 0 CT1 27 (SUTURE) ×1
SUT VIC AB 0 CT1 27XBRD ANBCTR (SUTURE) ×2 IMPLANT
SUT VIC AB 0 CT1 8-18 (SUTURE)
SUT VIC AB 2-0 CT1 27 (SUTURE) ×1
SUT VIC AB 2-0 CT1 TAPERPNT 27 (SUTURE) ×2 IMPLANT
SUT VIC AB 3-0 SH 8-18 (SUTURE) ×3 IMPLANT
SYR 20CC LL (SYRINGE) ×3 IMPLANT
SYR CONTROL 10ML LL (SYRINGE) ×3 IMPLANT
TOWEL OR 17X24 6PK STRL BLUE (TOWEL DISPOSABLE) ×3 IMPLANT
TOWEL OR 17X26 10 PK STRL BLUE (TOWEL DISPOSABLE) ×3 IMPLANT
TRAY FOLEY CATH 16FRSI W/METER (SET/KITS/TRAYS/PACK) IMPLANT
WATER STERILE IRR 1000ML POUR (IV SOLUTION) ×3 IMPLANT

## 2013-07-02 NOTE — Progress Notes (Signed)
Orthopedic Tech Progress Note Patient Details:  Raechel ChuteSherriel XXXBroadnax 1990/10/30 [id number]  Ortho Devices Type of Ortho Device: Ace wrap;Post (short leg) splint Ortho Device/Splint Location: rle Ortho Device/Splint Interventions: Application Order changed to posterior leg splint br Dr. Lina SayreLandeau   Crawford, Rembert 07/02/2013, 9:05 AM
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
## 2013-07-02 NOTE — ED Notes (Signed)
Called Flow Manager currently waiting for Step Down Beds.

## 2013-07-02 NOTE — ED Notes (Signed)
OR called ready for patient  

## 2013-07-02 NOTE — ED Notes (Signed)
Pt transported to CT scan.

## 2013-07-02 NOTE — Anesthesia Procedure Notes (Signed)
Procedure Name: Intubation Date/Time: 07/02/2013 3:48 PM Performed by: Wray KearnsFOLEY, ROGER A Pre-anesthesia Checklist: Patient identified, Timeout performed, Emergency Drugs available, Suction available and Patient being monitored Patient Re-evaluated:Patient Re-evaluated prior to inductionOxygen Delivery Method: Circle system utilized Preoxygenation: Pre-oxygenation with 100% oxygen Intubation Type: IV induction, Rapid sequence and Cricoid Pressure applied Laryngoscope Size: Mac and 3 Grade View: Grade I Tube type: Oral Tube size: 7.0 mm Number of attempts: 1 Airway Equipment and Method: Stylet Placement Confirmation: ETT inserted through vocal cords under direct vision,  breath sounds checked- equal and bilateral and positive ETCO2 Secured at: 21 cm Tube secured with: Tape Dental Injury: Teeth and Oropharynx as per pre-operative assessment
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
## 2013-07-02 NOTE — Preoperative (Signed)
Beta Blockers   Reason not to administer Beta Blockers:Not Applicable 
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
## 2013-07-02 NOTE — Anesthesia Preprocedure Evaluation (Addendum)
Anesthesia Evaluation  Patient identified by MRN, date of birth, ID band Patient awake    Reviewed: Allergy & Precautions, H&P , NPO status , Patient's Chart, lab work & pertinent test results, reviewed documented beta blocker date and time   Airway Mallampati: I TM Distance: >3 FB     Dental  (+) Teeth Intact and Dental Advisory Given   Pulmonary Current Smoker,  breath sounds clear to auscultation        Cardiovascular Rhythm:Regular Rate:Normal     Neuro/Psych    GI/Hepatic   Endo/Other    Renal/GU      Musculoskeletal   Abdominal   Peds  Hematology   Anesthesia Other Findings   Reproductive/Obstetrics                          Anesthesia Physical Anesthesia Plan  ASA: II and emergent  Anesthesia Plan: General   Post-op Pain Management:    Induction: Intravenous  Airway Management Planned: Oral ETT  Additional Equipment:   Intra-op Plan:   Post-operative Plan: Extubation in OR  Informed Consent: I have reviewed the patients History and Physical, chart, labs and discussed the procedure including the risks, benefits and alternatives for the proposed anesthesia with the patient or authorized representative who has indicated his/her understanding and acceptance.   Dental advisory given  Plan Discussed with: Anesthesiologist and Surgeon  Anesthesia Plan Comments:         Anesthesia Quick Evaluation

## 2013-07-02 NOTE — Anesthesia Postprocedure Evaluation (Signed)
  Anesthesia Post-op Note  Patient: Brianna Maynard  Procedure(s) Performed: Procedure(s): INTRAMEDULLARY (IM) NAIL TIBIAL (Right) REMOVAL FOREIGN BODY PELVIS (Right)  Patient Location: PACU  Anesthesia Type:General  Level of Consciousness: awake  Airway and Oxygen Therapy: Patient Spontanous Breathing  Post-op Pain: mild  Post-op Assessment: Post-op Vital signs reviewed  Post-op Vital Signs: stable  Complications: No apparent anesthesia complications
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
## 2013-07-02 NOTE — ED Notes (Signed)
Pt taken to X-ray following CT

## 2013-07-02 NOTE — ED Notes (Signed)
Attempted x 2 to get patient for CT by Tito Dinehobeka McCoy- Not ready @ [phone]

## 2013-07-02 NOTE — ED Notes (Signed)
Called OR patient is on list to have surgery today. Does not have a schedule at this time.  Patient notified.
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
## 2013-07-03 ENCOUNTER — Encounter (HOSPITAL_COMMUNITY): Payer: Self-pay | Admitting: *Deleted

## 2013-07-03 DIAGNOSIS — D62 Acute posthemorrhagic anemia: Secondary | ICD-10-CM

## 2013-07-03 LAB — CBC
HCT: 25 % — ABNORMAL LOW (ref 36.0–46.0)
Hemoglobin: 8.8 g/dL — ABNORMAL LOW (ref 12.0–15.0)
MCH: 28.3 pg (ref 26.0–34.0)
MCHC: 35.2 g/dL (ref 30.0–36.0)
MCV: 80.4 fL (ref 78.0–100.0)
Platelets: 146 10*3/uL — ABNORMAL LOW (ref 150–400)
RBC: 3.11 MIL/uL — ABNORMAL LOW (ref 3.87–5.11)
RDW: 13.7 % (ref 11.5–15.5)
WBC: 8.4 10*3/uL (ref 4.0–10.5)

## 2013-07-03 LAB — BASIC METABOLIC PANEL
BUN: 4 mg/dL — ABNORMAL LOW (ref 6–23)
CO2: 27 mEq/L (ref 19–32)
Calcium: 8.6 mg/dL (ref 8.4–10.5)
Chloride: 100 mEq/L (ref 96–112)
Creatinine, Ser: 0.54 mg/dL (ref 0.50–1.10)
GFR calc Af Amer: >90 mL/min (ref >90)
GFR calc non Af Amer: >90 mL/min (ref >90)
Glucose, Bld: 142 mg/dL — ABNORMAL HIGH (ref 70–99)
Potassium: 4.1 mEq/L (ref 3.7–5.3)
Sodium: 138 mEq/L (ref 137–147)

## 2013-07-03 LAB — MRSA PCR SCREENING: MRSA by PCR: NEGATIVE

## 2013-07-03 MED ORDER — HYDROMORPHONE HCL 1 MG/ML PO LIQD: 1.0000 mg | ORAL | Status: DC | PRN

## 2013-07-03 MED ORDER — HYDROMORPHONE HCL PF 1 MG/ML IJ SOLN
1.0000 mg | INTRAMUSCULAR | Status: DC | PRN
  Administered 2013-07-03 – 2013-07-05 (×11): 1 mg via INTRAVENOUS
  Filled 2013-07-03 (×12): qty 1

## 2013-07-03 MED ORDER — OXYCODONE HCL 5 MG PO TABS
10.0000 mg | ORAL_TABLET | ORAL | Status: DC | PRN
  Administered 2013-07-03 (×3): 20 mg via ORAL
  Administered 2013-07-03: 10 mg via ORAL
  Administered 2013-07-04 – 2013-07-05 (×7): 20 mg via ORAL
  Filled 2013-07-03 (×12): qty 4

## 2013-07-03 MED ORDER — POLYETHYLENE GLYCOL 3350 17 G PO PACK
17.0000 g | PACK | Freq: Every day | ORAL | Status: DC
  Administered 2013-07-03 – 2013-07-05 (×3): 17 g via ORAL
  Filled 2013-07-03 (×3): qty 1

## 2013-07-03 MED ORDER — HYDROMORPHONE HCL PF 1 MG/ML IJ SOLN
0.5000 mg | INTRAMUSCULAR | Status: DC | PRN
  Administered 2013-07-03 (×2): 0.5 mg via INTRAVENOUS
  Filled 2013-07-03 (×2): qty 1

## 2013-07-03 MED ORDER — OXYCODONE HCL 5 MG PO TABS
20.0000 mg | ORAL_TABLET | Freq: Once | ORAL | Status: AC
  Administered 2013-07-03: 20 mg via ORAL
  Filled 2013-07-03: qty 4
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
## 2013-07-03 NOTE — Progress Notes (Signed)
Pt transferred to 5N via wheelchair with belongings. Jewelry is with [employer] examinerlaw enforcement.
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
## 2013-07-03 NOTE — Progress Notes (Signed)
Up in chair. Mobilize with therapies. Patient examined and I agree with the assessment and plan  Violeta GelinasBurke Thompson, MD, MPH, FACS Pager: [phone]  07/03/2013 11:04 AM
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
## 2013-07-04 LAB — CBC
HCT: 25.4 % — ABNORMAL LOW (ref 36.0–46.0)
Hemoglobin: 9 g/dL — ABNORMAL LOW (ref 12.0–15.0)
MCH: 28.3 pg (ref 26.0–34.0)
MCHC: 35.4 g/dL (ref 30.0–36.0)
MCV: 79.9 fL (ref 78.0–100.0)
Platelets: 148 10*3/uL — ABNORMAL LOW (ref 150–400)
RBC: 3.18 MIL/uL — ABNORMAL LOW (ref 3.87–5.11)
RDW: 14 % (ref 11.5–15.5)
WBC: 10.5 10*3/uL (ref 4.0–10.5)

## 2013-07-04 MED ORDER — TRAMADOL HCL 50 MG PO TABS
100.0000 mg | ORAL_TABLET | Freq: Four times a day (QID) | ORAL | Status: DC
  Administered 2013-07-04 – 2013-07-05 (×6): 100 mg via ORAL
  Filled 2013-07-04 (×7): qty 2

## 2013-07-04 MED ORDER — MORPHINE SULFATE ER 15 MG PO TBCR
15.0000 mg | EXTENDED_RELEASE_TABLET | Freq: Two times a day (BID) | ORAL | Status: DC
  Administered 2013-07-04: 15 mg via ORAL
  Filled 2013-07-04 (×2): qty 1
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
## 2013-07-04 NOTE — Progress Notes (Signed)
Working on pain control. I also, per her request, spoke to her mother on the phone. Patient examined and I agree with the assessment and plan  Violeta GelinasBurke Thompson, MD, MPH, FACS Pager: [phone]  07/04/2013 1:07 PM

## 2013-07-04 NOTE — Progress Notes (Signed)
UR completed.  Michelle Bryson, RN BSN MHA CCM Trauma/Neuro ICU Case Manager [phone]  
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
## 2013-07-05 MED ORDER — TRAMADOL HCL 50 MG PO TABS: 100.0000 mg | ORAL_TABLET | Freq: Four times a day (QID) | ORAL | Status: DC

## 2013-07-05 MED ORDER — OXYCODONE HCL ER 15 MG PO T12A
15.0000 mg | EXTENDED_RELEASE_TABLET | Freq: Two times a day (BID) | ORAL | Status: DC
  Administered 2013-07-05: 15 mg via ORAL
  Filled 2013-07-05: qty 1

## 2013-07-05 MED ORDER — OXYCODONE HCL ER 10 MG PO T12A: EXTENDED_RELEASE_TABLET | ORAL | Status: DC

## 2013-07-05 MED ORDER — METHOCARBAMOL 500 MG PO TABS: 500.0000 mg | ORAL_TABLET | Freq: Four times a day (QID) | ORAL | Status: DC | PRN

## 2013-07-05 MED ORDER — OXYCODONE-ACETAMINOPHEN 10-325 MG PO TABS: 1.0000 | ORAL_TABLET | ORAL | Status: DC | PRN

## 2013-07-05 MED ORDER — HYDROMORPHONE HCL PF 1 MG/ML IJ SOLN
0.5000 mg | INTRAMUSCULAR | Status: DC | PRN
  Administered 2013-07-05: 0.5 mg via INTRAVENOUS
  Filled 2013-07-05: qty 1
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
## 2013-07-05 NOTE — Progress Notes (Signed)
[facility] and explained the process to her. She stated understanding of the process.  Also discussed DME with her. She wished to have a rolling walker in addition to the crutches for home. That was called in to [facility] as it was not with the original DME orders for 3-n-1 and tub bench.  Those will deliver to pt room before discharge.

## 2013-07-05 NOTE — Discharge Instructions (Signed)
Tibial Fracture, Adult You have a fracture (break in bone) of your tibia. This is the large "shin" bone in your lower leg. These fractures are easily diagnosed with x-rays. TREATMENT  You have a simple fracture which usually will heal without disability. It can be treated with simple immobilization. This means the bone can be held with a cast or splint in a favorable position until your caregiver feels it is stable (healed well enough). Then you can begin range of motion exercises to keep your knee and ankle limber (moving well). HOME CARE INSTRUCTIONS   Apply ice to the injury for 15-20 minutes, 03-04 times per day while awake, for 2 days. Put the ice in a plastic bag and place a thin towel between the bag of ice and your cast.  If you have a plaster or fiberglass cast:  Do not try to scratch the skin under the cast using sharp or pointed objects.  Check the skin around the cast every day. You may put lotion on any red or sore areas.  Keep your cast dry and clean.  If you have a plaster splint:  Wear the splint as directed.  You may loosen the elastic around the splint if your toes become numb, tingle, or turn cold or blue.  Do not put pressure on any part of your cast or splint until it is fully hardened.  Your cast or splint can be protected during bathing with a plastic bag. Do not lower the cast or splint into water.  Use crutches as directed.  Only take over-the-counter or prescription medicines for pain, discomfort, or fever as directed by your caregiver.  See your caregiver as directed. It is very important to keep all follow-up referrals and appointments in order to avoid any long-term problems with your leg and ankle including chronic pain, inability to move the ankle normally, failure of the fracture to heal and permanent disability. SEEK IMMEDIATE MEDICAL CARE IF:   Pain is becoming worse rather than better, or if pain is uncontrolled with medications.  You have  increased swelling, pain, or redness in the foot.  You begin to lose feeling in your foot or toes.  You develop a cold or blue foot or toes on the injured side.  You develop severe pain in your injured leg, especially if it is increased with movement of your toes. Document Released: 03/03/2001 Document Revised: 08/31/2011 Document Reviewed: 09/25/2008 [facility] Patient Information 2014 [place], [place].  No driving while taking oxycodone.

## 2013-07-05 NOTE — Progress Notes (Signed)
Orthopedic Tech Progress Note Patient Details:  Brianna Maynard Dec 25, 1990 [id number]  Ortho Devices Type of Ortho Device: Crutches Ortho Device/Splint Location: rle Ortho Device/Splint Interventions: Application   Brianna Maynard, Brianna Maynard 07/05/2013, 10:52 AM
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
## 2013-07-05 NOTE — Progress Notes (Signed)
Orthopedic Tech Progress Note Patient Details:  Raechel ChuteSherriel XXXBroadnax 19-Jul-1990 [id number]  Patient ID: Raechel ChuteSherriel XXXBroadnax, female   DOB: 19-Jul-1990, 23 y.o.   MRN: [id number]   Nikki DomCrawford, Rembert 07/05/2013, 10:52 AM Viewed order from rn order list

## 2013-07-05 NOTE — Progress Notes (Signed)
Patient has been cleared by PT with crutches.  Pain will always be an issue, but seems to be doing well on regimen of OxyContin, Oxy IR, and Muscle relaxant.  Should be able to go home this afternoon.  This patient has been seen and I agree with the findings and treatment plan.  Marta LamasJames O. Gae BonWyatt, III, MD, FACS [phone] (pager) [phone] (direct pager) Trauma Surgeon
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
## 2013-07-05 NOTE — Progress Notes (Signed)
Patient d/c to home, IV removed. Prescriptions given and instructions reviewed. 

## 2013-07-06 DIAGNOSIS — S32301A Unspecified fracture of right ilium, initial encounter for closed fracture: Secondary | ICD-10-CM | POA: Diagnosis present
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
## 2013-07-10 ENCOUNTER — Telehealth (HOSPITAL_COMMUNITY): Payer: Self-pay | Admitting: Emergency Medicine

## 2013-07-10 NOTE — Telephone Encounter (Signed)
Pt called Friday with fevers. She said she ran them on and off over weekend up to 101 but they seem to have resolved now. No drainage from wounds but is having increased pain. She has an appointment with ortho this week and I suggested she keep that but if she needs to be seen sooner or ortho needs us to reevaluate to call us back.

## 2013-07-10 NOTE — Progress Notes (Signed)
Agree with above 

## 2014-01-01 ENCOUNTER — Encounter (HOSPITAL_COMMUNITY): Payer: Self-pay | Admitting: Emergency Medicine

## 2014-01-01 ENCOUNTER — Emergency Department (HOSPITAL_COMMUNITY)
Admission: EM | Admit: 2014-01-01 | Discharge: 2014-01-01 | Disposition: A | Discharge status: Home / Self Care | Payer: MEDICAID | Attending: Emergency Medicine | Admitting: Emergency Medicine

## 2014-01-01 DIAGNOSIS — K089 Disorder of teeth and supporting structures, unspecified: Secondary | ICD-10-CM | POA: Insufficient documentation

## 2014-01-01 DIAGNOSIS — Z9889 Other specified postprocedural states: Secondary | ICD-10-CM | POA: Insufficient documentation

## 2014-01-01 DIAGNOSIS — F17200 Nicotine dependence, unspecified, uncomplicated: Secondary | ICD-10-CM | POA: Insufficient documentation

## 2014-01-01 DIAGNOSIS — S025XXA Fracture of tooth (traumatic), initial encounter for closed fracture: Secondary | ICD-10-CM

## 2014-01-01 DIAGNOSIS — Z8781 Personal history of (healed) traumatic fracture: Secondary | ICD-10-CM | POA: Insufficient documentation

## 2014-01-01 DIAGNOSIS — K0381 Cracked tooth: Secondary | ICD-10-CM | POA: Insufficient documentation

## 2014-01-01 DIAGNOSIS — K0889 Other specified disorders of teeth and supporting structures: Secondary | ICD-10-CM

## 2014-01-01 MED ORDER — AMOXICILLIN 500 MG PO CAPS: 500.0000 mg | ORAL_CAPSULE | Freq: Three times a day (TID) | ORAL | Status: DC

## 2014-01-01 MED ORDER — TRAMADOL HCL 50 MG PO TABS: 50.0000 mg | ORAL_TABLET | Freq: Four times a day (QID) | ORAL | Status: DC | PRN

## 2014-01-01 MED ORDER — NAPROXEN 500 MG PO TABS: 500.0000 mg | ORAL_TABLET | Freq: Two times a day (BID) | ORAL | Status: DC

## 2014-01-01 MED ORDER — OXYCODONE-ACETAMINOPHEN 5-325 MG PO TABS
1.0000 | ORAL_TABLET | Freq: Once | ORAL | Status: AC
  Administered 2014-01-01: 1 via ORAL
  Filled 2014-01-01: qty 1

## 2014-01-01 MED ORDER — IBUPROFEN 400 MG PO TABS
600.0000 mg | ORAL_TABLET | Freq: Once | ORAL | Status: AC
  Administered 2014-01-01: 600 mg via ORAL
  Filled 2014-01-01: qty 2

## 2014-01-01 MED ORDER — AMOXICILLIN 250 MG PO CAPS
500.0000 mg | ORAL_CAPSULE | Freq: Once | ORAL | Status: AC
  Administered 2014-01-01: 500 mg via ORAL
  Filled 2014-01-01: qty 2

## 2014-01-01 NOTE — ED Notes (Signed)
Patient complaining of bottom right dental pain starting today.

## 2014-01-01 NOTE — ED Notes (Signed)
Had tooth pulled last year the one beside of that has been chipping some without pain, yesterday start having pain, and 2 hours ago got much worse. On lower right

## 2014-01-01 NOTE — ED Provider Notes (Signed)
CSN: [id number]     Arrival date & time 01/01/14  2153 History   First MD Initiated Contact with Patient 01/01/14 2155     Chief Complaint  Patient presents with  . Dental Pain     (Consider location/radiation/quality/duration/timing/severity/associated sxs/prior Treatment) Patient is a 23 y.o. female presenting with tooth pain. The history is provided by the patient.  Dental Pain Location:  Lower Lower teeth location:  31/RL 2nd molar Quality:  Throbbing Onset quality:  Gradual Duration:  24 hours Timing:  Constant Progression:  Worsening Chronicity:  New Context: enamel fracture   Relieved by:  None tried Worsened by:  Cold food/drink, touching and pressure Ineffective treatments:  None tried Associated symptoms: facial pain    Brianna Maynard is a 23 y.o. female who presents to the ED with dental pain that started today on the right lower side. She states that last year she had the tooth beside it extracted. A few days after that she noted that the tooth was chipped but it never gave her a problem. It has continued to break off and then yesterday it started to hurt. Tonight the pain has been severe. She has taken nothing for pain.   Past Medical History  Diagnosis Date  . Closed fracture of proximal tibia 07/02/2013   Past Surgical History  Procedure Laterality Date  . Mouth surgery    . Tibia im nail insertion Right 07/02/2013    Procedure: INTRAMEDULLARY (IM) NAIL TIBIAL;  Surgeon: Eulas PostJoshua P Landau, MD;  Location: [facility] OR;  Service: Orthopedics;  Laterality: Right;  . Foreign body removal Right 07/02/2013    Procedure: REMOVAL FOREIGN BODY PELVIS;  Surgeon: Eulas PostJoshua P Landau, MD;  Location: [facility] OR;  Service: Orthopedics;  Laterality: Right;   History reviewed. No pertinent family history. History  Substance Use Topics  . Smoking status: Current Every Day Smoker -- 1.00 packs/day    Types: Cigarettes  . Smokeless tobacco: Not on file  . Alcohol Use: No   OB History   Grav Para Term Preterm Abortions TAB SAB Ect Mult Living   3              Review of Systems Negative except as stated in HPI   Allergies  Review of patient's allergies indicates no known allergies.  Home Medications   Prior to Admission medications   Medication Sig Start Date End Date Taking? Authorizing Provider  methocarbamol (ROBAXIN) 500 MG tablet Take 1 tablet (500 mg total) by mouth every 6 (six) hours as needed for muscle spasms. 07/05/13   Freeman CaldronMichael J. Jeffery, PA-C  OxyCODONE (OXYCONTIN) 10 mg T12A 12 hr tablet 20mg  po q12 x7d then 10mg  po q12h x7d 07/05/13   Freeman CaldronMichael J. Jeffery, PA-C  oxyCODONE-acetaminophen (PERCOCET) 10-325 MG per tablet Take 1-2 tablets by mouth every 4 (four) hours as needed for pain. 07/05/13   Freeman CaldronMichael J. Jeffery, PA-C  traMADol (ULTRAM) 50 MG tablet Take 2 tablets (100 mg total) by mouth every 6 (six) hours. 07/05/13   Freeman CaldronMichael J. Jeffery, PA-C   BP 119/72  Pulse 84  Temp(Src) 98 F (36.7 C) (Oral)  Resp 24  Ht 5\' 5"  (1.651 m)  Wt 114 lb (51.71 kg)  BMI 18.97 kg/m2  SpO2 100%  LMP 12/11/2013 Physical Exam  Nursing note and vitals reviewed. Constitutional: She is oriented to person, place, and time. She appears well-developed and well-nourished.  HENT:  Head: Normocephalic.  Mouth/Throat: Uvula is midline, oropharynx is clear and moist and mucous membranes  are normal.    Lower right second molar broken.   Eyes: Conjunctivae and EOM are normal.  Neck: Neck supple.  Cardiovascular: Normal rate and regular rhythm.   Pulmonary/Chest: Effort normal. She has no wheezes. She has no rales.  Musculoskeletal: Normal range of motion.  Neurological: She is alert and oriented to person, place, and time. No cranial nerve deficit.  Skin: Skin is warm and dry.  Psychiatric: She has a normal mood and affect. Her behavior is normal.    ED Course  Procedures (including critical care time) Labs Review  MDM  23 y.o. female with dental pain and broken right  second molar. Stable for discharge without fever or other problems. Will treat for pain and infection. She has made an appointment with a dentist for follow up. Discussed with the patient and all questioned fully answered. She will return if any problems arise.    Medication List    TAKE these medications       amoxicillin 500 MG capsule  Commonly known as:  AMOXIL  Take 1 capsule (500 mg total) by mouth 3 (three) times daily.     naproxen 500 MG tablet  Commonly known as:  NAPROSYN  Take 1 tablet (500 mg total) by mouth 2 (two) times daily.      ASK your doctor about these medications       methocarbamol 500 MG tablet  Commonly known as:  ROBAXIN  Take 1 tablet (500 mg total) by mouth every 6 (six) hours as needed for muscle spasms.     OxyCODONE 10 mg T12a 12 hr tablet  Commonly known as:  OXYCONTIN  20mg  po q12 x7d then 10mg  po q12h x7d     oxyCODONE-acetaminophen 10-325 MG per tablet  Commonly known as:  PERCOCET  Take 1-2 tablets by mouth every 4 (four) hours as needed for pain.     traMADol 50 MG tablet  Commonly known as:  ULTRAM  Take 2 tablets (100 mg total) by mouth every 6 (six) hours.  Ask about: Which instructions should I use?     traMADol 50 MG tablet  Commonly known as:  ULTRAM  Take 1 tablet (50 mg total) by mouth every 6 (six) hours as needed.  Ask about: Which instructions should I use?          Brianna Maynard, [place] 01/01/14 2222

## 2014-01-02 NOTE — ED Provider Notes (Signed)
Medical screening examination/treatment/procedure(s) were performed by non-physician practitioner and as supervising physician I was immediately available for consultation/collaboration.   EKG Interpretation None        Kathleen M McManus, DO 01/02/14 2351 

## 2014-04-23 ENCOUNTER — Encounter (HOSPITAL_COMMUNITY): Payer: Self-pay | Admitting: Emergency Medicine

## 2015-01-15 ENCOUNTER — Encounter (HOSPITAL_COMMUNITY): Payer: Self-pay

## 2015-01-15 ENCOUNTER — Emergency Department (HOSPITAL_COMMUNITY)
Admission: EM | Admit: 2015-01-15 | Discharge: 2015-01-15 | Disposition: A | Discharge status: Home / Self Care | Payer: Medicaid Other | Attending: Emergency Medicine | Admitting: Emergency Medicine

## 2015-01-15 DIAGNOSIS — K029 Dental caries, unspecified: Secondary | ICD-10-CM | POA: Insufficient documentation

## 2015-01-15 DIAGNOSIS — Z791 Long term (current) use of non-steroidal anti-inflammatories (NSAID): Secondary | ICD-10-CM | POA: Insufficient documentation

## 2015-01-15 DIAGNOSIS — Z792 Long term (current) use of antibiotics: Secondary | ICD-10-CM | POA: Insufficient documentation

## 2015-01-15 DIAGNOSIS — Z720 Tobacco use: Secondary | ICD-10-CM | POA: Insufficient documentation

## 2015-01-15 DIAGNOSIS — Z8781 Personal history of (healed) traumatic fracture: Secondary | ICD-10-CM | POA: Insufficient documentation

## 2015-01-15 MED ORDER — TRAMADOL HCL 50 MG PO TABS: 50.0000 mg | ORAL_TABLET | Freq: Four times a day (QID) | ORAL | Status: DC | PRN

## 2015-01-15 NOTE — ED Notes (Signed)
Pt c/o toothache since Thursday.

## 2015-01-15 NOTE — Discharge Instructions (Signed)

## 2015-01-15 NOTE — ED Notes (Signed)
Discharge papers given -- Pt also provided with various information concerning Dental care and other services in [facility] . Pt also informed of potential free dental [place] - Stated that she will call to determine if this available

## 2015-01-15 NOTE — ED Provider Notes (Signed)
CSN: [id number]     Arrival date & time 01/15/15  1201 History   First MD Initiated Contact with Patient 01/15/15 1203     Chief Complaint  Patient presents with  . Dental Pain     (Consider location/radiation/quality/duration/timing/severity/associated sxs/prior Treatment) Patient is a 24 y.o. female presenting with tooth pain. The history is provided by the patient.  Dental Pain Location:  Lower Lower teeth location:  18/LL 2nd molar Quality:  Shooting and throbbing Severity:  Moderate Onset quality:  Gradual Duration:  5 days Timing:  Constant Progression:  Worsening Chronicity:  New Context: dental caries and dental fracture   Prior workup: None. Relieved by:  Nothing Worsened by:  Cold food/drink Ineffective treatments:  NSAIDs Associated symptoms: no difficulty swallowing, no drooling, no facial pain, no facial swelling, no fever, no gum swelling, no neck pain, no neck swelling, no oral bleeding, no oral lesions and no trismus     Past Medical History  Diagnosis Date  . Closed fracture of proximal tibia 07/02/2013   Past Surgical History  Procedure Laterality Date  . Mouth surgery    . Tibia im nail insertion Right 07/02/2013    Procedure: INTRAMEDULLARY (IM) NAIL TIBIAL;  Surgeon: Eulas Post, MD;  Location: [facility] OR;  Service: Orthopedics;  Laterality: Right;  . Foreign body removal Right 07/02/2013    Procedure: REMOVAL FOREIGN BODY PELVIS;  Surgeon: Eulas Post, MD;  Location: [facility] OR;  Service: Orthopedics;  Laterality: Right;   No family history on file. History  Substance Use Topics  . Smoking status: Current Every Day Smoker -- 1.00 packs/day    Types: Cigarettes  . Smokeless tobacco: Not on file  . Alcohol Use: No   OB History    Gravida Para Term Preterm AB TAB SAB Ectopic Multiple Living   3              Review of Systems  Constitutional: Negative for fever.  HENT: Positive for dental problem. Negative for drooling, facial swelling, mouth sores  and sore throat.   Respiratory: Negative for shortness of breath.   Musculoskeletal: Negative for neck pain and neck stiffness.      Allergies  Review of patient's allergies indicates no known allergies.  Home Medications   Prior to Admission medications   Medication Sig Start Date End Date Taking? Authorizing Provider  amoxicillin (AMOXIL) 500 MG capsule Take 1 capsule (500 mg total) by mouth 3 (three) times daily. Patient not taking: Reported on 01/15/2015 01/01/14   [name], NP  methocarbamol (ROBAXIN) 500 MG tablet Take 1 tablet (500 mg total) by mouth every 6 (six) hours as needed for muscle spasms. Patient not taking: Reported on 01/15/2015 07/05/13   Freeman Caldron, PA-C  naproxen (NAPROSYN) 500 MG tablet Take 1 tablet (500 mg total) by mouth 2 (two) times daily. Patient not taking: Reported on 01/15/2015 01/01/14   [name], NP  OxyCODONE (OXYCONTIN) 10 mg T12A 12 hr tablet  po q12 x7d then  po q12h x7d Patient not taking: Reported on 01/15/2015 07/05/13   Freeman Caldron, PA-C  oxyCODONE-acetaminophen (PERCOCET) 10-325 MG per tablet Take 1-2 tablets by mouth every 4 (four) hours as needed for pain. Patient not taking: Reported on 01/15/2015 07/05/13   Freeman Caldron, PA-C  traMADol (ULTRAM) 50 MG tablet Take 1 tablet (50 mg total) by mouth every 6 (six) hours as needed. 01/15/15   Burgess Amor, PA-C   BP 139/89 mmHg  Pulse  93  Temp(Src) 98.2 F (36.8 C) (Oral)  Resp 20  Ht 5\' 5"  (1.651 m)  Wt 116 lb (52.617 kg)  BMI 19.30 kg/m2  SpO2 100%  LMP 01/14/2015 Physical Exam  Constitutional: She is oriented to person, place, and time. She appears well-developed and well-nourished. No distress.  HENT:  Head: Normocephalic and atraumatic.  Right Ear: Tympanic membrane and external ear normal.  Left Ear: Tympanic membrane and external ear normal.  Mouth/Throat: Oropharynx is clear and moist and mucous membranes are normal. No oral lesions. No trismus in the jaw.  Dental caries present. No dental abscesses.    Eyes: Conjunctivae are normal.  Neck: Normal range of motion. Neck supple.  Cardiovascular: Normal rate and normal heart sounds.   Pulmonary/Chest: Effort normal.  Musculoskeletal: Normal range of motion.  Lymphadenopathy:    She has no cervical adenopathy.  Neurological: She is alert and oriented to person, place, and time.  Skin: Skin is warm and dry. No erythema.  Psychiatric: She has a normal mood and affect.    ED Course  Procedures (including critical care time) Labs Review Labs Reviewed - No data to display  Imaging Review No results found.   EKG Interpretation None      MDM   Final diagnoses:  Dental cavity    Patient was given dental referrals today.  She is prescribed tramadol.  She was encouraged to consider using dental putty which she could pick up at any drugstore for temporary relief of her nerve sensitivity pain.  When necessary follow-up anticipated.  No sign of infection or abscess today.    Burgess Amor, PA-C 01/15/15 1345  [name], MD 01/15/15 [phone]
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
## 2016-06-16 ENCOUNTER — Encounter (HOSPITAL_COMMUNITY): Payer: Self-pay | Admitting: Emergency Medicine

## 2016-06-16 ENCOUNTER — Emergency Department (HOSPITAL_COMMUNITY): Payer: Medicaid Other

## 2016-06-16 DIAGNOSIS — S99912A Unspecified injury of left ankle, initial encounter: Secondary | ICD-10-CM | POA: Diagnosis present

## 2016-06-16 DIAGNOSIS — Z79899 Other long term (current) drug therapy: Secondary | ICD-10-CM | POA: Insufficient documentation

## 2016-06-16 DIAGNOSIS — F17210 Nicotine dependence, cigarettes, uncomplicated: Secondary | ICD-10-CM | POA: Insufficient documentation

## 2016-06-16 DIAGNOSIS — S93402A Sprain of unspecified ligament of left ankle, initial encounter: Secondary | ICD-10-CM | POA: Diagnosis not present

## 2016-06-16 DIAGNOSIS — W1839XA Other fall on same level, initial encounter: Secondary | ICD-10-CM | POA: Diagnosis not present

## 2016-06-16 DIAGNOSIS — Y929 Unspecified place or not applicable: Secondary | ICD-10-CM | POA: Insufficient documentation

## 2016-06-16 DIAGNOSIS — Y939 Activity, unspecified: Secondary | ICD-10-CM | POA: Diagnosis not present

## 2016-06-16 DIAGNOSIS — Y999 Unspecified external cause status: Secondary | ICD-10-CM | POA: Insufficient documentation
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
## 2016-06-17 ENCOUNTER — Emergency Department (HOSPITAL_COMMUNITY)
Admission: EM | Admit: 2016-06-17 | Discharge: 2016-06-17 | Disposition: A | Discharge status: Home / Self Care | Payer: Medicaid Other | Attending: Emergency Medicine | Admitting: Emergency Medicine

## 2016-06-17 DIAGNOSIS — S93402A Sprain of unspecified ligament of left ankle, initial encounter: Secondary | ICD-10-CM

## 2016-06-17 MED ORDER — IBUPROFEN 400 MG PO TABS
600.0000 mg | ORAL_TABLET | Freq: Once | ORAL | Status: AC
  Administered 2016-06-17: 600 mg via ORAL
  Filled 2016-06-17: qty 2

## 2016-06-17 MED ORDER — ACETAMINOPHEN 325 MG PO TABS
650.0000 mg | ORAL_TABLET | Freq: Once | ORAL | Status: AC
  Administered 2016-06-17: 650 mg via ORAL
  Filled 2016-06-17: qty 2
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
## 2018-03-17 ENCOUNTER — Encounter (HOSPITAL_COMMUNITY): Payer: Self-pay | Admitting: Emergency Medicine

## 2018-03-17 ENCOUNTER — Other Ambulatory Visit: Payer: Self-pay

## 2018-03-17 ENCOUNTER — Emergency Department (HOSPITAL_COMMUNITY)
Admission: EM | Admit: 2018-03-17 | Discharge: 2018-03-17 | Disposition: A | Discharge status: Home / Self Care | Payer: Self-pay | Attending: Emergency Medicine | Admitting: Emergency Medicine

## 2018-03-17 DIAGNOSIS — R1110 Vomiting, unspecified: Secondary | ICD-10-CM | POA: Insufficient documentation

## 2018-03-17 DIAGNOSIS — R109 Unspecified abdominal pain: Secondary | ICD-10-CM | POA: Insufficient documentation

## 2018-03-17 DIAGNOSIS — Z5321 Procedure and treatment not carried out due to patient leaving prior to being seen by health care provider: Secondary | ICD-10-CM | POA: Insufficient documentation

## 2018-03-17 LAB — CBC
HCT: 39.1 % (ref 36.0–46.0)
Hemoglobin: 12.7 g/dL (ref 12.0–15.0)
MCH: 27.4 pg (ref 26.0–34.0)
MCHC: 32.5 g/dL (ref 30.0–36.0)
MCV: 84.4 fL (ref 78.0–100.0)
Platelets: 236 10*3/uL (ref 150–400)
RBC: 4.63 MIL/uL (ref 3.87–5.11)
RDW: 13.7 % (ref 11.5–15.5)
WBC: 8.2 10*3/uL (ref 4.0–10.5)

## 2018-03-17 LAB — COMPREHENSIVE METABOLIC PANEL
ALT: 70 U/L — ABNORMAL HIGH (ref 0–44)
AST: 42 U/L — ABNORMAL HIGH (ref 15–41)
Albumin: 4.5 g/dL (ref 3.5–5.0)
Alkaline Phosphatase: 59 U/L (ref 38–126)
Anion gap: 11 (ref 5–15)
BUN: 11 mg/dL (ref 6–20)
CO2: 24 mmol/L (ref 22–32)
Calcium: 9.2 mg/dL (ref 8.9–10.3)
Chloride: 102 mmol/L (ref 98–111)
Creatinine, Ser: 0.47 mg/dL (ref 0.44–1.00)
GFR calc Af Amer: >60 mL/min (ref >60)
GFR calc non Af Amer: >60 mL/min (ref >60)
Glucose, Bld: 119 mg/dL — ABNORMAL HIGH (ref 70–99)
Potassium: 3.5 mmol/L (ref 3.5–5.1)
Sodium: 137 mmol/L (ref 135–145)
Total Bilirubin: 0.6 mg/dL (ref 0.3–1.2)
Total Protein: 8.1 g/dL (ref 6.5–8.1)

## 2018-03-17 LAB — LIPASE, BLOOD: Lipase: 21 U/L (ref 11–51)

## 2018-03-17 MED ORDER — ONDANSETRON 4 MG PO TBDP
4.0000 mg | ORAL_TABLET | Freq: Once | ORAL | Status: AC | PRN
  Administered 2018-03-17: 4 mg via ORAL
  Filled 2018-03-17: qty 1

## 2018-03-17 NOTE — ED Triage Notes (Signed)
Pt reports sudden onset of abd pain and V/. Currently V/ when called for triage. Denies sick contacts, D/, or fever.

## 2018-03-17 NOTE — ED Notes (Signed)
Pt no longer in waiting room. Urine specimen cup with pt's label and BP cuff in lobby.

## 2018-03-17 NOTE — ED Notes (Signed)
Pt called X 2 with no answer. 

## 2018-08-06 ENCOUNTER — Other Ambulatory Visit: Payer: Self-pay

## 2018-08-06 ENCOUNTER — Encounter (HOSPITAL_COMMUNITY): Payer: Self-pay

## 2018-08-06 ENCOUNTER — Emergency Department (HOSPITAL_COMMUNITY)
Admission: EM | Admit: 2018-08-06 | Discharge: 2018-08-06 | Disposition: A | Discharge status: Home / Self Care | Payer: Medicaid Other | Attending: Emergency Medicine | Admitting: Emergency Medicine

## 2018-08-06 DIAGNOSIS — F17210 Nicotine dependence, cigarettes, uncomplicated: Secondary | ICD-10-CM | POA: Insufficient documentation

## 2018-08-06 DIAGNOSIS — O99322 Drug use complicating pregnancy, second trimester: Secondary | ICD-10-CM | POA: Insufficient documentation

## 2018-08-06 DIAGNOSIS — F1990 Other psychoactive substance use, unspecified, uncomplicated: Secondary | ICD-10-CM

## 2018-08-06 DIAGNOSIS — Z3A19 19 weeks gestation of pregnancy: Secondary | ICD-10-CM | POA: Diagnosis not present

## 2018-08-06 DIAGNOSIS — Z3492 Encounter for supervision of normal pregnancy, unspecified, second trimester: Secondary | ICD-10-CM

## 2018-08-06 HISTORY — DX: Encounter for supervision of normal pregnancy, unspecified, unspecified trimester: Z34.90

## 2018-08-06 MED ORDER — ONDANSETRON 4 MG PO TBDP: 4.0000 mg | ORAL_TABLET | Freq: Four times a day (QID) | ORAL | 0 refills | Status: AC | PRN

## 2018-08-06 MED ORDER — ACETAMINOPHEN 325 MG PO TABS: 650.0000 mg | ORAL_TABLET | Freq: Four times a day (QID) | ORAL | 0 refills | Status: AC | PRN

## 2018-08-06 MED ORDER — HYDROXYZINE HCL 25 MG PO TABS: 25.0000 mg | ORAL_TABLET | Freq: Four times a day (QID) | ORAL | 0 refills | Status: AC

## 2018-08-06 NOTE — Discharge Instructions (Addendum)
If you experience symptoms of abdominal cramping and muscle aches, nausea, vomiting, please use the regimen below as prescribed.  You may take Zofran dissolved under the tongue every 6 hours needed for nausea and vomiting. 2.   You may take Tylenol, 650 mg every 6 hours as needed for abdominal cramping. 3.    You may take hydroxyzine 25 mg every 6 hours as needed for itching and sensation of anxiety.   Please go to the emergency department if you develop any nausea or vomiting that prevents you from keeping anything down despite the nausea medicine.
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
## 2018-08-06 NOTE — ED Notes (Signed)
Patient asleep on stretcher in no distress
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
## 2020-06-22 NOTE — L&D Delivery Note (Signed)
OB/GYN Faculty Practice Delivery Note  Brianna Maynard is a 30 y.o. [id number] s/p SVD at [redacted]w[redacted]d. She was admitted for active labor and SROM.   ROM: rupture date, rupture time, delivery date, or delivery time have not been documented with clear fluid GBS Status: negative Maximum Maternal Temperature: pending (precipitous delivery)  Labor Progress: Patient presented via EMS completely dilated and SROMed.   Delivery Date/Time: [phone] on 05/13/2021 Delivery: Called to room and patient was complete and pushing. Head delivered ROA. No nuchal cord present. Shoulder and body delivered in usual fashion. Infant with spontaneous cry, placed on mother's abdomen, dried and stimulated. Cord clamped x 2 after 1-minute delay, and cut by patient. Cord blood drawn. Placenta delivered spontaneously with gentle cord traction. Fundus firm with massage and Pitocin. Labia, perineum, vagina, and cervix inspected and found to have a shallow periurethral laceration on the right that was hemostatic and therefore not repaired.   Placenta: intact, 3V cord, L&D Complications: None Lacerations: Right periurethral EBL: 25cc Analgesia: None  Infant: female  APGARs 7,8  weight pending  Warner Mccreedy, MD, MPH OB Fellow, Faculty Practice Center for [facility], [facility] 05/13/2021, 5:13 AM

## 2020-10-08 ENCOUNTER — Other Ambulatory Visit: Payer: Self-pay

## 2020-10-08 ENCOUNTER — Emergency Department (HOSPITAL_COMMUNITY): Payer: Medicaid Other

## 2020-10-08 ENCOUNTER — Emergency Department (HOSPITAL_COMMUNITY)
Admission: EM | Admit: 2020-10-08 | Discharge: 2020-10-08 | Disposition: A | Discharge status: Home / Self Care | Payer: Medicaid Other | Attending: Emergency Medicine | Admitting: Emergency Medicine

## 2020-10-08 ENCOUNTER — Encounter (HOSPITAL_COMMUNITY): Payer: Self-pay | Admitting: *Deleted

## 2020-10-08 DIAGNOSIS — R112 Nausea with vomiting, unspecified: Secondary | ICD-10-CM

## 2020-10-08 DIAGNOSIS — R197 Diarrhea, unspecified: Secondary | ICD-10-CM | POA: Insufficient documentation

## 2020-10-08 DIAGNOSIS — O219 Vomiting of pregnancy, unspecified: Secondary | ICD-10-CM | POA: Insufficient documentation

## 2020-10-08 DIAGNOSIS — Z3A01 Less than 8 weeks gestation of pregnancy: Secondary | ICD-10-CM | POA: Diagnosis not present

## 2020-10-08 DIAGNOSIS — F17210 Nicotine dependence, cigarettes, uncomplicated: Secondary | ICD-10-CM | POA: Diagnosis not present

## 2020-10-08 DIAGNOSIS — R1084 Generalized abdominal pain: Secondary | ICD-10-CM | POA: Insufficient documentation

## 2020-10-08 LAB — LIPASE, BLOOD: Lipase: 16 U/L (ref 11–51)

## 2020-10-08 LAB — URINALYSIS, ROUTINE W REFLEX MICROSCOPIC
Bacteria, UA: NONE SEEN
Bilirubin Urine: NEGATIVE
Glucose, UA: NEGATIVE mg/dL
Hgb urine dipstick: NEGATIVE
Ketones, ur: 80 mg/dL — AB
Leukocytes,Ua: NEGATIVE
Nitrite: NEGATIVE
Protein, ur: 100 mg/dL — AB
Specific Gravity, Urine: 1.011 (ref 1.005–1.030)
pH: 7 (ref 5.0–8.0)

## 2020-10-08 LAB — CBC WITH DIFFERENTIAL/PLATELET
Abs Immature Granulocytes: 0.02 10*3/uL (ref 0.00–0.07)
Basophils Absolute: 0 10*3/uL (ref 0.0–0.1)
Basophils Relative: 0 %
Eosinophils Absolute: 0 10*3/uL (ref 0.0–0.5)
Eosinophils Relative: 0 %
HCT: 40.6 % (ref 36.0–46.0)
Hemoglobin: 13.2 g/dL (ref 12.0–15.0)
Immature Granulocytes: 0 %
Lymphocytes Relative: 15 %
Lymphs Abs: 1.2 10*3/uL (ref 0.7–4.0)
MCH: 27.6 pg (ref 26.0–34.0)
MCHC: 32.5 g/dL (ref 30.0–36.0)
MCV: 84.9 fL (ref 80.0–100.0)
Monocytes Absolute: 0.1 10*3/uL (ref 0.1–1.0)
Monocytes Relative: 2 %
Neutro Abs: 6.5 10*3/uL (ref 1.7–7.7)
Neutrophils Relative %: 83 %
Platelets: 290 10*3/uL (ref 150–400)
RBC: 4.78 MIL/uL (ref 3.87–5.11)
RDW: 13.2 % (ref 11.5–15.5)
WBC: 7.9 10*3/uL (ref 4.0–10.5)
nRBC: 0 % (ref 0.0–0.2)

## 2020-10-08 LAB — COMPREHENSIVE METABOLIC PANEL
ALT: 82 U/L — ABNORMAL HIGH (ref 0–44)
AST: 41 U/L (ref 15–41)
Albumin: 4.6 g/dL (ref 3.5–5.0)
Alkaline Phosphatase: 66 U/L (ref 38–126)
Anion gap: 14 (ref 5–15)
BUN: 6 mg/dL (ref 6–20)
CO2: 21 mmol/L — ABNORMAL LOW (ref 22–32)
Calcium: 9.6 mg/dL (ref 8.9–10.3)
Chloride: 100 mmol/L (ref 98–111)
Creatinine, Ser: 0.5 mg/dL (ref 0.44–1.00)
GFR, Estimated: >60 mL/min (ref >60)
Glucose, Bld: 100 mg/dL — ABNORMAL HIGH (ref 70–99)
Potassium: 3.6 mmol/L (ref 3.5–5.1)
Sodium: 135 mmol/L (ref 135–145)
Total Bilirubin: 0.3 mg/dL (ref 0.3–1.2)
Total Protein: 8.8 g/dL — ABNORMAL HIGH (ref 6.5–8.1)

## 2020-10-08 LAB — HCG, QUANTITATIVE, PREGNANCY: hCG, Beta Chain, Quant, S: 52703 m[IU]/mL — ABNORMAL HIGH (ref <5)

## 2020-10-08 MED ORDER — MORPHINE SULFATE (PF) 4 MG/ML IV SOLN
4.0000 mg | Freq: Once | INTRAVENOUS | Status: AC
  Administered 2020-10-08: 4 mg via INTRAVENOUS
  Filled 2020-10-08: qty 1

## 2020-10-08 MED ORDER — ONDANSETRON 4 MG PO TBDP: 4.0000 mg | ORAL_TABLET | Freq: Three times a day (TID) | ORAL | 0 refills | Status: DC | PRN

## 2020-10-08 MED ORDER — SODIUM CHLORIDE 0.9 % IV BOLUS
1000.0000 mL | Freq: Once | INTRAVENOUS | Status: AC
  Administered 2020-10-08: 1000 mL via INTRAVENOUS
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
## 2020-10-08 NOTE — Discharge Instructions (Signed)
Please pick up medication and take as needed for nausea. Drink plenty of fluids to stay hydrated. We have tested you for COVID today. We will call you if you test positive.   Follow up with your OBGYN regarding your positive pregnancy test today and for repeat ultrasound in 1-2 weeks.   Return to the ED for any worsening symptoms

## 2020-10-08 NOTE — ED Triage Notes (Signed)
Vomiting onset last night with burning in epigastric area

## 2020-10-08 NOTE — ED Notes (Signed)
Water provided to the patient.

## 2021-04-30 ENCOUNTER — Ambulatory Visit (INDEPENDENT_AMBULATORY_CARE_PROVIDER_SITE_OTHER): Payer: Medicaid Other | Admitting: Advanced Practice Midwife

## 2021-04-30 ENCOUNTER — Other Ambulatory Visit: Payer: Self-pay

## 2021-04-30 ENCOUNTER — Other Ambulatory Visit (HOSPITAL_COMMUNITY)
Admission: RE | Admit: 2021-04-30 | Discharge: 2021-04-30 | Disposition: A | Discharge status: Home / Self Care | Payer: Medicaid Other | Source: Ambulatory Visit | Attending: Advanced Practice Midwife | Admitting: Advanced Practice Midwife

## 2021-04-30 ENCOUNTER — Encounter: Payer: Self-pay | Admitting: Advanced Practice Midwife

## 2021-04-30 VITALS — BP 124/64 | HR 79 | Wt 150.0 lb

## 2021-04-30 DIAGNOSIS — Z302 Encounter for sterilization: Secondary | ICD-10-CM

## 2021-04-30 DIAGNOSIS — Z8751 Personal history of pre-term labor: Secondary | ICD-10-CM

## 2021-04-30 DIAGNOSIS — O0933 Supervision of pregnancy with insufficient antenatal care, third trimester: Secondary | ICD-10-CM | POA: Diagnosis not present

## 2021-04-30 DIAGNOSIS — F1510 Other stimulant abuse, uncomplicated: Secondary | ICD-10-CM | POA: Insufficient documentation

## 2021-04-30 DIAGNOSIS — Z3480 Encounter for supervision of other normal pregnancy, unspecified trimester: Secondary | ICD-10-CM | POA: Insufficient documentation

## 2021-04-30 DIAGNOSIS — Z3A35 35 weeks gestation of pregnancy: Secondary | ICD-10-CM | POA: Insufficient documentation

## 2021-04-30 DIAGNOSIS — Z23 Encounter for immunization: Secondary | ICD-10-CM | POA: Diagnosis not present

## 2021-04-30 DIAGNOSIS — O0930 Supervision of pregnancy with insufficient antenatal care, unspecified trimester: Secondary | ICD-10-CM | POA: Insufficient documentation

## 2021-04-30 DIAGNOSIS — Z363 Encounter for antenatal screening for malformations: Secondary | ICD-10-CM

## 2021-04-30 DIAGNOSIS — Z3490 Encounter for supervision of normal pregnancy, unspecified, unspecified trimester: Secondary | ICD-10-CM | POA: Insufficient documentation

## 2021-04-30 DIAGNOSIS — F1410 Cocaine abuse, uncomplicated: Secondary | ICD-10-CM | POA: Insufficient documentation

## 2021-04-30 DIAGNOSIS — N926 Irregular menstruation, unspecified: Secondary | ICD-10-CM | POA: Diagnosis not present

## 2021-04-30 LAB — POCT URINALYSIS DIPSTICK OB
Blood, UA: NEGATIVE
Glucose, UA: NEGATIVE
Ketones, UA: NEGATIVE
Leukocytes, UA: NEGATIVE
Nitrite, UA: NEGATIVE

## 2021-04-30 NOTE — Progress Notes (Signed)
INITIAL OBSTETRICAL VISIT Patient name: Brianna Maynard MRN [id number]  Date of birth: Dec 30, 1990 Chief Complaint:   Initial Prenatal Visit  History of Present Illness:   Brianna Maynard is a 30 y.o. [id number] African-American female at [redacted]w[redacted]d by [place] at 6.5 weeks with an Estimated Date of Delivery: 05/29/21 being seen today for her initial obstetrical visit.   Patient's last menstrual period was 09/11/2020. Her obstetrical history is significant for  PTB x 3 (34-36wks); had 6wk scan at [facility] and then reports issues getting insurance coverage and also incarceration that kept her from initiating [facility]; states her older 3 children are with her mother in [place]  .   Today she reports pelvic pressure.  Last pap 08/11/20. Results were: negative per pt report at [facility]  Depression screen [facility] 2/9 04/30/2021  Decreased Interest 0  Down, Depressed, Hopeless 0  PHQ - 2 Score 0  Altered sleeping 0  Tired, decreased energy 0  Change in appetite 0  Feeling bad or failure about yourself  0  Trouble concentrating 0  Moving slowly or fidgety/restless 0  Suicidal thoughts 0  PHQ-9 Score 0    No flowsheet data found.   Review of Systems:   Pertinent items are noted in HPI Denies cramping/contractions, leakage of fluid, vaginal bleeding, abnormal vaginal discharge w/ itching/odor/irritation, headaches, visual changes, shortness of breath, chest pain, abdominal pain, severe nausea/vomiting, or problems with urination or bowel movements unless otherwise stated above.  Pertinent History Reviewed:  Reviewed past medical,surgical, social, obstetrical and family history.  Reviewed problem list, medications and allergies. OB History  Gravida Para Term Preterm AB Living  6 3 0 3 1 3   SAB IAB Ectopic Multiple Live Births    1          # Outcome Date GA Lbr Len/2nd Weight Sex Delivery Anes PTL Lv  6 Current           5 Preterm 10/27/09 [redacted]w[redacted]d   M Vag-Spont     4 Preterm 11/28/08 [redacted]w[redacted]d    F Vag-Spont     3 Preterm 07/17/06 [redacted]w[redacted]d   M Vag-Spont  Y   2 Gravida           1 IAB            Physical Assessment:   Vitals:   04/30/21 1550  BP: 124/64  Pulse: 79  Weight: 150 lb (68 kg)  Body mass index is 25.75 kg/m.       Physical Examination:  General appearance - well appearing, and in no distress  Mental status - alert, oriented to person, place, and time  Psych:  She has a normal mood and affect  Skin - warm and dry, normal color, no suspicious lesions noted  Chest - effort normal, all lung fields clear to auscultation bilaterally  Heart - normal rate and regular rhythm  Abdomen - soft, nontender; FH 35cm, FHR 128  Extremities:  No swelling or varicosities noted  Pelvic - VULVA: normal appearing vulva with no masses, tenderness or lesions  VAGINA: normal appearing vagina with normal color and discharge, no lesions  CERVIX: 1/thick/vtx -2  Thin prep pap is not done   Chaperone: 13/09/22    TODAY'S NT too late  Results for orders placed or performed in visit on 04/30/21 (from the past 24 hour(s))  POC Urinalysis Dipstick OB   Collection Time: 04/30/21  4:14 PM  Result Value Ref Range   Color, UA  Clarity, UA     Glucose, UA Negative Negative   Bilirubin, UA     Ketones, UA neg    Spec Grav, UA     Blood, UA neg    pH, UA     POC,PROTEIN,UA Small (1+) Negative, Trace, Small (1+), Moderate (2+), Large (3+), 4+   Urobilinogen, UA     Nitrite, UA neg    Leukocytes, UA Negative Negative   Appearance     Odor      Assessment & Plan:  1) Low-Risk Pregnancy [id number] at [redacted]w[redacted]d with an Estimated Date of Delivery: 05/29/21   2) Initial OB visit with late onset to care @ 35.6wks  3) Hx PTB x 3 (34-36wks)  4) Hx cocaine/meth use  5) Desires ppBTL> will sign 30d papers today; she understands if she delivers prior to 37wks but after 72h, this can be done inpatient; otherwise will plan for outpatient BTL  Meds: No orders of the defined types were placed  in this encounter.   Initial labs obtained Continue prenatal vitamins Reviewed n/v relief measures and warning s/s to report Reviewed recommended weight gain based on pre-gravid BMI Encouraged well-balanced diet Genetic & carrier screening discussed: too late for [facility], NT/IT, and [facility] , too late for [employer]  Ultrasound discussed; fetal survey: requested CCNC completed> form faxed if has or is planning to apply for medicaid The nature of [employer] for [employer] with multiple MDs and other Advanced Practice Providers was explained to patient; also emphasized that fellows, residents, and students are part of our team. Does not have home bp cuff. Office bp cuff given: yes. Rx sent: n/a. Check bp weekly, let us know if consistently >140/90.   Too late for ASA therapy (per uptodate)   Follow-up: Return in about 1 week (around 05/07/2021) for LROB, in person; ASAP anatomy u/s, Sign BTL consent today.   Orders Placed This Encounter  Procedures   Urine Culture   Culture, beta strep (group b only)   US OB Comp + 14 Wk   Tdap vaccine greater than or equal to 7yo IM   CBC/D/Plt+RPR+Rh+ABO+RubIgG...   Pain Management Screening Profile (10S)   HgB A1c   POC Urinalysis Dipstick OB    Arabella Merles [facility] 04/30/2021 4:59 PM

## 2021-04-30 NOTE — Patient Instructions (Signed)
Laryah, thank you for choosing our office today! We appreciate the opportunity to meet your healthcare needs. You may receive a short survey by mail, e-mail, or through [employer]. If you are happy with your care we would appreciate if you could take just a few minutes to complete the survey questions. We read all of your comments and take your feedback very seriously. Thank you again for choosing our office.  Center for [employer] [facility] Team at [facility]  [facility] at [facility] ([address] [place], [place] [address]) Entrance C, located off of [address] Free 24/7 valet parking   Nausea & Vomiting Have saltine crackers or pretzels by your bed and eat a few bites before you raise your head out of bed in the morning Eat small frequent meals throughout the day instead of large meals Drink plenty of fluids throughout the day to stay hydrated, just don't drink a lot of fluids with your meals.  This can make your stomach fill up faster making you feel sick Do not brush your teeth right after you eat Products with real ginger are good for nausea, like ginger ale and ginger hard candy Make sure it says made with real ginger! Sucking on sour candy like lemon heads is also good for nausea If your prenatal vitamins make you nauseated, take them at night so you will sleep through the nausea Sea Bands If you feel like you need medicine for the nausea & vomiting please let us know If you are unable to keep any fluids or food down please let us know   Constipation Drink plenty of fluid, preferably water, throughout the day Eat foods high in fiber such as fruits, vegetables, and grains Exercise, such as walking, is a good way to keep your bowels regular Drink warm fluids, especially warm prune juice, or decaf coffee Eat a 1/2 cup of real oatmeal (not instant), 1/2 cup applesauce, and 1/2-1 cup warm prune juice every day If needed, you may take Colace (docusate sodium) stool  softener once or twice a day to help keep the stool soft.  If you still are having problems with constipation, you may take Miralax once daily as needed to help keep your bowels regular.   Home Blood Pressure Monitoring for Patients   Your provider has recommended that you check your blood pressure (BP) at least once a week at home. If you do not have a blood pressure cuff at home, one will be provided for you. Contact your provider if you have not received your monitor within 1 week.   Helpful Tips for Accurate Home Blood Pressure Checks  Don't smoke, exercise, or drink caffeine 30 minutes before checking your BP Use the restroom before checking your BP (a full bladder can raise your pressure) Relax in a comfortable upright chair Feet on the ground Left arm resting comfortably on a flat surface at the level of your heart Legs uncrossed Back supported Sit quietly and don't talk Place the cuff on your bare arm Adjust snuggly, so that only two fingertips can fit between your skin and the top of the cuff Check 2 readings separated by at least one minute Keep a log of your BP readings For a visual, please reference this diagram: http://ccnc.care/bpdiagram  Provider Name: [facility]     Phone: [phone]  Zone 1: ALL CLEAR  Continue to monitor your symptoms:  BP reading is less than 140 (top number) or less than 90 (bottom  number)  No right upper stomach pain No headaches or seeing spots No feeling nauseated or throwing up No swelling in face and hands  Zone 2: CAUTION Call your doctor's office for any of the following:  BP reading is greater than 140 (top number) or greater than 90 (bottom number)  Stomach pain under your ribs in the middle or right side Headaches or seeing spots Feeling nauseated or throwing up Swelling in face and hands  Zone 3: EMERGENCY  Seek immediate medical care if you have any of the following:  BP reading is greater than160 (top number) or  greater than 110 (bottom number) Severe headaches not improving with Tylenol Serious difficulty catching your breath Any worsening symptoms from Zone 2    First Trimester of Pregnancy The first trimester of pregnancy is from week 1 until the end of week 12 (months 1 through 3). A week after a sperm fertilizes an egg, the egg will implant on the wall of the uterus. This embryo will begin to develop into a baby. Genes from you and your partner are forming the baby. The female genes determine whether the baby is a boy or a girl. At 6-8 weeks, the eyes and face are formed, and the heartbeat can be seen on ultrasound. At the end of 12 weeks, all the baby's organs are formed.  Now that you are pregnant, you will want to do everything you can to have a healthy baby. Two of the most important things are to get good prenatal care and to follow your health care provider's instructions. Prenatal care is all the medical care you receive before the baby's birth. This care will help prevent, find, and treat any problems during the pregnancy and childbirth. BODY CHANGES Your body goes through many changes during pregnancy. The changes vary from woman to woman.  You may gain or lose a couple of pounds at first. You may feel sick to your stomach (nauseous) and throw up (vomit). If the vomiting is uncontrollable, call your health care provider. You may tire easily. You may develop headaches that can be relieved by medicines approved by your health care provider. You may urinate more often. Painful urination may mean you have a bladder infection. You may develop heartburn as a result of your pregnancy. You may develop constipation because certain hormones are causing the muscles that push waste through your intestines to slow down. You may develop hemorrhoids or swollen, bulging veins (varicose veins). Your breasts may begin to grow larger and become tender. Your nipples may stick out more, and the tissue that  surrounds them (areola) may become darker. Your gums may bleed and may be sensitive to brushing and flossing. Dark spots or blotches (chloasma, mask of pregnancy) may develop on your face. This will likely fade after the baby is born. Your menstrual periods will stop. You may have a loss of appetite. You may develop cravings for certain kinds of food. You may have changes in your emotions from day to day, such as being excited to be pregnant or being concerned that something may go wrong with the pregnancy and baby. You may have more vivid and strange dreams. You may have changes in your hair. These can include thickening of your hair, rapid growth, and changes in texture. Some women also have hair loss during or after pregnancy, or hair that feels dry or thin. Your hair will most likely return to normal after your baby is born. WHAT TO EXPECT AT YOUR PRENATAL  VISITS During a routine prenatal visit: You will be weighed to make sure you and the baby are growing normally. Your blood pressure will be taken. Your abdomen will be measured to track your baby's growth. The fetal heartbeat will be listened to starting around week 10 or 12 of your pregnancy. Test results from any previous visits will be discussed. Your health care provider may ask you: How you are feeling. If you are feeling the baby move. If you have had any abnormal symptoms, such as leaking fluid, bleeding, severe headaches, or abdominal cramping. If you have any questions. Other tests that may be performed during your first trimester include: Blood tests to find your blood type and to check for the presence of any previous infections. They will also be used to check for low iron levels (anemia) and Rh antibodies. Later in the pregnancy, blood tests for diabetes will be done along with other tests if problems develop. Urine tests to check for infections, diabetes, or protein in the urine. An ultrasound to confirm the proper growth  and development of the baby. An amniocentesis to check for possible genetic problems. Fetal screens for spina bifida and Down syndrome. You may need other tests to make sure you and the baby are doing well. HOME CARE INSTRUCTIONS  Medicines Follow your health care provider's instructions regarding medicine use. Specific medicines may be either safe or unsafe to take during pregnancy. Take your prenatal vitamins as directed. If you develop constipation, try taking a stool softener if your health care provider approves. Diet Eat regular, well-balanced meals. Choose a variety of foods, such as meat or vegetable-based protein, fish, milk and low-fat dairy products, vegetables, fruits, and whole grain breads and cereals. Your health care provider will help you determine the amount of weight gain that is right for you. Avoid raw meat and uncooked cheese. These carry germs that can cause birth defects in the baby. Eating four or five small meals rather than three large meals a day may help relieve nausea and vomiting. If you start to feel nauseous, eating a few soda crackers can be helpful. Drinking liquids between meals instead of during meals also seems to help nausea and vomiting. If you develop constipation, eat more high-fiber foods, such as fresh vegetables or fruit and whole grains. Drink enough fluids to keep your urine clear or pale yellow. Activity and Exercise Exercise only as directed by your health care provider. Exercising will help you: Control your weight. Stay in shape. Be prepared for labor and delivery. Experiencing pain or cramping in the lower abdomen or low back is a good sign that you should stop exercising. Check with your health care provider before continuing normal exercises. Try to avoid standing for long periods of time. Move your legs often if you must stand in one place for a long time. Avoid heavy lifting. Wear low-heeled shoes, and practice good posture. You may  continue to have sex unless your health care provider directs you otherwise. Relief of Pain or Discomfort Wear a good support bra for breast tenderness.   Take warm sitz baths to soothe any pain or discomfort caused by hemorrhoids. Use hemorrhoid cream if your health care provider approves.   Rest with your legs elevated if you have leg cramps or low back pain. If you develop varicose veins in your legs, wear support hose. Elevate your feet for 15 minutes, 3-4 times a day. Limit salt in your diet. Prenatal Care Schedule your prenatal visits by the  twelfth week of pregnancy. They are usually scheduled monthly at first, then more often in the last 2 months before delivery. Write down your questions. Take them to your prenatal visits. Keep all your prenatal visits as directed by your health care provider. Safety Wear your seat belt at all times when driving. Make a list of emergency phone numbers, including numbers for family, friends, the hospital, and police and fire departments. General Tips Ask your health care provider for a referral to a local prenatal education class. Begin classes no later than at the beginning of month 6 of your pregnancy. Ask for help if you have counseling or nutritional needs during pregnancy. Your health care provider can offer advice or refer you to specialists for help with various needs. Do not use hot tubs, steam rooms, or saunas. Do not douche or use tampons or scented sanitary pads. Do not cross your legs for long periods of time. Avoid cat litter boxes and soil used by cats. These carry germs that can cause birth defects in the baby and possibly loss of the fetus by miscarriage or stillbirth. Avoid all smoking, herbs, alcohol, and medicines not prescribed by your health care provider. Chemicals in these affect the formation and growth of the baby. Schedule a dentist appointment. At home, brush your teeth with a soft toothbrush and be gentle when you floss. SEEK  MEDICAL CARE IF:  You have dizziness. You have mild pelvic cramps, pelvic pressure, or nagging pain in the abdominal area. You have persistent nausea, vomiting, or diarrhea. You have a bad smelling vaginal discharge. You have pain with urination. You notice increased swelling in your face, hands, legs, or ankles. SEEK IMMEDIATE MEDICAL CARE IF:  You have a fever. You are leaking fluid from your vagina. You have spotting or bleeding from your vagina. You have severe abdominal cramping or pain. You have rapid weight gain or loss. You vomit blood or material that looks like coffee grounds. You are exposed to [place] measles and have never had them. You are exposed to fifth disease or chickenpox. You develop a severe headache. You have shortness of breath. You have any kind of trauma, such as from a fall or a car accident. Document Released: 06/02/2001 Document Revised: 10/23/2013 Document Reviewed: 04/18/2013 [facility] Patient Information 2015 [place], [place]. This information is not intended to replace advice given to you by your health care provider. Make sure you discuss any questions you have with your health care provider.

## 2021-05-02 ENCOUNTER — Other Ambulatory Visit: Payer: Medicaid Other

## 2021-05-02 ENCOUNTER — Other Ambulatory Visit: Payer: Self-pay

## 2021-05-02 ENCOUNTER — Ambulatory Visit (INDEPENDENT_AMBULATORY_CARE_PROVIDER_SITE_OTHER): Payer: Medicaid Other

## 2021-05-02 DIAGNOSIS — Z3A36 36 weeks gestation of pregnancy: Secondary | ICD-10-CM

## 2021-05-02 DIAGNOSIS — Z363 Encounter for antenatal screening for malformations: Secondary | ICD-10-CM | POA: Diagnosis not present

## 2021-05-02 DIAGNOSIS — O0932 Supervision of pregnancy with insufficient antenatal care, second trimester: Secondary | ICD-10-CM

## 2021-05-02 DIAGNOSIS — Z302 Encounter for sterilization: Secondary | ICD-10-CM

## 2021-05-02 DIAGNOSIS — Z3480 Encounter for supervision of other normal pregnancy, unspecified trimester: Secondary | ICD-10-CM

## 2021-05-02 LAB — CERVICOVAGINAL ANCILLARY ONLY
Chlamydia: NEGATIVE
Comment: NEGATIVE
Comment: NORMAL
Neisseria Gonorrhea: NEGATIVE

## 2021-05-02 LAB — HEPATITIS C ANTIBODY: HCV Ab: POSITIVE

## 2021-05-02 NOTE — Progress Notes (Addendum)
[place] 36+1 wks,cephalic,posterior placenta gr 3,AFI 14.5 cm,normal right ovary,left ovary not visualized,fhr 121 bpm,EFW 2551 g 22%,anatomy complete,limited because of fetal age

## 2021-05-03 LAB — HEMOGLOBIN A1C
Est. average glucose Bld gHb Est-mCnc: 97 mg/dL
Hgb A1c MFr Bld: 5 % (ref 4.8–5.6)

## 2021-05-04 LAB — CBC/D/PLT+RPR+RH+ABO+RUBIGG...
Antibody Screen: NEGATIVE
Basophils Absolute: 0 10*3/uL (ref 0.0–0.2)
Basos: 0 %
EOS (ABSOLUTE): 0 10*3/uL (ref 0.0–0.4)
Eos: 0 %
HCV Ab: 9.8 s/co ratio — ABNORMAL HIGH (ref 0.0–0.9)
HIV Screen 4th Generation wRfx: NONREACTIVE
Hematocrit: 31.2 % — ABNORMAL LOW (ref 34.0–46.6)
Hemoglobin: 10.3 g/dL — ABNORMAL LOW (ref 11.1–15.9)
Hepatitis B Surface Ag: NEGATIVE
Immature Grans (Abs): 0 10*3/uL (ref 0.0–0.1)
Immature Granulocytes: 0 %
Lymphocytes Absolute: 2.5 10*3/uL (ref 0.7–3.1)
Lymphs: 36 %
MCH: 28 pg (ref 26.6–33.0)
MCHC: 33 g/dL (ref 31.5–35.7)
MCV: 85 fL (ref 79–97)
Monocytes Absolute: 0.4 10*3/uL (ref 0.1–0.9)
Monocytes: 6 %
Neutrophils Absolute: 4.1 10*3/uL (ref 1.4–7.0)
Neutrophils: 58 %
Platelets: 243 10*3/uL (ref 150–450)
RBC: 3.68 x10E6/uL — ABNORMAL LOW (ref 3.77–5.28)
RDW: 13.1 % (ref 11.7–15.4)
RPR Ser Ql: NONREACTIVE
Rh Factor: POSITIVE
Rubella Antibodies, IGG: 2.21 index (ref >0.99)
WBC: 7.1 10*3/uL (ref 3.4–10.8)

## 2021-05-04 LAB — HCV RT-PCR, QUANT (NON-GRAPH)
HCV log10: 5.707 log10 IU/mL
Hepatitis C Quantitation: 509000 IU/mL

## 2021-05-04 LAB — CULTURE, BETA STREP (GROUP B ONLY): Strep Gp B Culture: NEGATIVE

## 2021-05-05 ENCOUNTER — Other Ambulatory Visit: Payer: Medicaid Other

## 2021-05-05 DIAGNOSIS — Z3A36 36 weeks gestation of pregnancy: Secondary | ICD-10-CM

## 2021-05-06 ENCOUNTER — Encounter: Payer: Self-pay | Admitting: Advanced Practice Midwife

## 2021-05-06 ENCOUNTER — Other Ambulatory Visit: Payer: Self-pay | Admitting: Advanced Practice Midwife

## 2021-05-06 DIAGNOSIS — O99013 Anemia complicating pregnancy, third trimester: Secondary | ICD-10-CM

## 2021-05-06 DIAGNOSIS — O99019 Anemia complicating pregnancy, unspecified trimester: Secondary | ICD-10-CM | POA: Insufficient documentation

## 2021-05-06 DIAGNOSIS — B1710 Acute hepatitis C without hepatic coma: Secondary | ICD-10-CM | POA: Insufficient documentation

## 2021-05-06 MED ORDER — FERROUS SULFATE 325 (65 FE) MG PO TABS: 325.0000 mg | ORAL_TABLET | ORAL | 3 refills | Status: AC

## 2021-05-07 LAB — PMP SCREEN PROFILE (10S), URINE
Amphetamine Scrn, Ur: NEGATIVE ng/mL
BARBITURATE SCREEN URINE: NEGATIVE ng/mL
BENZODIAZEPINE SCREEN, URINE: NEGATIVE ng/mL
CANNABINOIDS UR QL SCN: POSITIVE ng/mL — AB
Cocaine (Metab) Scrn, Ur: POSITIVE ng/mL — AB
Creatinine(Crt), U: 86.9 mg/dL (ref 20.0–300.0)
Methadone Screen, Urine: NEGATIVE ng/mL
OXYCODONE+OXYMORPHONE UR QL SCN: NEGATIVE ng/mL
Opiate Scrn, Ur: POSITIVE ng/mL — AB
Ph of Urine: 6.9 (ref 4.5–8.9)
Phencyclidine Qn, Ur: NEGATIVE ng/mL
Propoxyphene Scrn, Ur: NEGATIVE ng/mL

## 2021-05-07 LAB — URINE CULTURE

## 2021-05-07 LAB — SPECIMEN STATUS REPORT

## 2021-05-08 ENCOUNTER — Encounter: Payer: Medicaid Other | Admitting: Advanced Practice Midwife

## 2021-05-13 ENCOUNTER — Encounter: Payer: Medicaid Other | Admitting: Women's Health

## 2021-05-13 ENCOUNTER — Inpatient Hospital Stay (HOSPITAL_COMMUNITY)
Admission: AD | Admit: 2021-05-13 | Discharge: 2021-05-14 | DRG: 806 | Discharge status: Against Medical Advice | Payer: Medicaid Other | Attending: Family Medicine | Admitting: Family Medicine

## 2021-05-13 ENCOUNTER — Encounter (HOSPITAL_COMMUNITY): Payer: Self-pay | Admitting: Obstetrics and Gynecology

## 2021-05-13 DIAGNOSIS — O9081 Anemia of the puerperium: Secondary | ICD-10-CM | POA: Diagnosis not present

## 2021-05-13 DIAGNOSIS — O98419 Viral hepatitis complicating pregnancy, unspecified trimester: Secondary | ICD-10-CM | POA: Diagnosis present

## 2021-05-13 DIAGNOSIS — F1410 Cocaine abuse, uncomplicated: Secondary | ICD-10-CM | POA: Diagnosis present

## 2021-05-13 DIAGNOSIS — B1710 Acute hepatitis C without hepatic coma: Secondary | ICD-10-CM | POA: Diagnosis present

## 2021-05-13 DIAGNOSIS — O9842 Viral hepatitis complicating childbirth: Secondary | ICD-10-CM | POA: Diagnosis present

## 2021-05-13 DIAGNOSIS — O99324 Drug use complicating childbirth: Secondary | ICD-10-CM | POA: Diagnosis present

## 2021-05-13 DIAGNOSIS — O26893 Other specified pregnancy related conditions, third trimester: Secondary | ICD-10-CM | POA: Diagnosis present

## 2021-05-13 DIAGNOSIS — Z3490 Encounter for supervision of normal pregnancy, unspecified, unspecified trimester: Secondary | ICD-10-CM

## 2021-05-13 DIAGNOSIS — O99334 Smoking (tobacco) complicating childbirth: Secondary | ICD-10-CM | POA: Diagnosis present

## 2021-05-13 DIAGNOSIS — D62 Acute posthemorrhagic anemia: Secondary | ICD-10-CM | POA: Diagnosis not present

## 2021-05-13 DIAGNOSIS — F17210 Nicotine dependence, cigarettes, uncomplicated: Secondary | ICD-10-CM | POA: Diagnosis present

## 2021-05-13 DIAGNOSIS — O99019 Anemia complicating pregnancy, unspecified trimester: Secondary | ICD-10-CM | POA: Diagnosis present

## 2021-05-13 DIAGNOSIS — Z20822 Contact with and (suspected) exposure to covid-19: Secondary | ICD-10-CM | POA: Diagnosis present

## 2021-05-13 DIAGNOSIS — Z3A37 37 weeks gestation of pregnancy: Secondary | ICD-10-CM | POA: Diagnosis not present

## 2021-05-13 DIAGNOSIS — Z5329 Procedure and treatment not carried out because of patient's decision for other reasons: Secondary | ICD-10-CM | POA: Diagnosis not present

## 2021-05-13 DIAGNOSIS — O0930 Supervision of pregnancy with insufficient antenatal care, unspecified trimester: Secondary | ICD-10-CM

## 2021-05-13 DIAGNOSIS — O4202 Full-term premature rupture of membranes, onset of labor within 24 hours of rupture: Secondary | ICD-10-CM | POA: Diagnosis not present

## 2021-05-13 LAB — COMPREHENSIVE METABOLIC PANEL
ALT: 36 U/L (ref 0–44)
AST: 32 U/L (ref 15–41)
Albumin: 2.4 g/dL — ABNORMAL LOW (ref 3.5–5.0)
Alkaline Phosphatase: 290 U/L — ABNORMAL HIGH (ref 38–126)
Anion gap: 10 (ref 5–15)
BUN: 8 mg/dL (ref 6–20)
CO2: 23 mmol/L (ref 22–32)
Calcium: 8.5 mg/dL — ABNORMAL LOW (ref 8.9–10.3)
Chloride: 102 mmol/L (ref 98–111)
Creatinine, Ser: 0.44 mg/dL (ref 0.44–1.00)
GFR, Estimated: >60 mL/min (ref >60)
Glucose, Bld: 108 mg/dL — ABNORMAL HIGH (ref 70–99)
Potassium: 3.7 mmol/L (ref 3.5–5.1)
Sodium: 135 mmol/L (ref 135–145)
Total Bilirubin: 0.4 mg/dL (ref 0.3–1.2)
Total Protein: 5.9 g/dL — ABNORMAL LOW (ref 6.5–8.1)

## 2021-05-13 LAB — CBC
HCT: 31.8 % — ABNORMAL LOW (ref 36.0–46.0)
Hemoglobin: 10.5 g/dL — ABNORMAL LOW (ref 12.0–15.0)
MCH: 27.9 pg (ref 26.0–34.0)
MCHC: 33 g/dL (ref 30.0–36.0)
MCV: 84.6 fL (ref 80.0–100.0)
Platelets: 300 10*3/uL (ref 150–400)
RBC: 3.76 MIL/uL — ABNORMAL LOW (ref 3.87–5.11)
RDW: 13.9 % (ref 11.5–15.5)
WBC: 11.5 10*3/uL — ABNORMAL HIGH (ref 4.0–10.5)
nRBC: 0 % (ref 0.0–0.2)

## 2021-05-13 LAB — RESP PANEL BY RT-PCR (FLU A&B, COVID) ARPGX2
Influenza A by PCR: NEGATIVE
Influenza B by PCR: NEGATIVE
SARS Coronavirus 2 by RT PCR: NEGATIVE

## 2021-05-13 LAB — TYPE AND SCREEN
ABO/RH(D): A POS
Antibody Screen: NEGATIVE

## 2021-05-13 LAB — RPR: RPR Ser Ql: NONREACTIVE

## 2021-05-13 MED ORDER — COCONUT OIL OIL: 1.0000 "application " | TOPICAL_OIL | Status: DC | PRN

## 2021-05-13 MED ORDER — ONDANSETRON HCL 4 MG PO TABS: 4.0000 mg | ORAL_TABLET | ORAL | Status: DC | PRN

## 2021-05-13 MED ORDER — OXYTOCIN-SODIUM CHLORIDE 30-0.9 UT/500ML-% IV SOLN: 2.5000 [IU]/h | INTRAVENOUS | Status: DC

## 2021-05-13 MED ORDER — MEASLES, MUMPS & RUBELLA VAC IJ SOLR: 0.5000 mL | Freq: Once | INTRAMUSCULAR | Status: DC

## 2021-05-13 MED ORDER — TETANUS-DIPHTH-ACELL PERTUSSIS 5-2.5-18.5 LF-MCG/0.5 IM SUSY: 0.5000 mL | PREFILLED_SYRINGE | Freq: Once | INTRAMUSCULAR | Status: DC

## 2021-05-13 MED ORDER — FERROUS SULFATE 325 (65 FE) MG PO TABS
325.0000 mg | ORAL_TABLET | ORAL | Status: DC
  Administered 2021-05-13: 325 mg via ORAL
  Filled 2021-05-13: qty 1

## 2021-05-13 MED ORDER — LABETALOL HCL 5 MG/ML IV SOLN: 20.0000 mg | Freq: Once | INTRAVENOUS | Status: AC

## 2021-05-13 MED ORDER — LACTATED RINGERS IV SOLN: 500.0000 mL | INTRAVENOUS | Status: DC | PRN

## 2021-05-13 MED ORDER — SOD CITRATE-CITRIC ACID 500-334 MG/5ML PO SOLN: 30.0000 mL | ORAL | Status: DC | PRN

## 2021-05-13 MED ORDER — LABETALOL HCL 5 MG/ML IV SOLN
INTRAVENOUS | Status: AC
  Administered 2021-05-13: 20 mg via INTRAVENOUS
  Filled 2021-05-13: qty 4

## 2021-05-13 MED ORDER — OXYCODONE-ACETAMINOPHEN 5-325 MG PO TABS
1.0000 | ORAL_TABLET | ORAL | Status: DC | PRN
  Administered 2021-05-13: 1 via ORAL
  Filled 2021-05-13: qty 1

## 2021-05-13 MED ORDER — MISOPROSTOL 200 MCG PO TABS
ORAL_TABLET | ORAL | Status: AC
  Filled 2021-05-13: qty 5

## 2021-05-13 MED ORDER — ONDANSETRON HCL 4 MG/2ML IJ SOLN: 4.0000 mg | Freq: Four times a day (QID) | INTRAMUSCULAR | Status: DC | PRN

## 2021-05-13 MED ORDER — OXYTOCIN-SODIUM CHLORIDE 30-0.9 UT/500ML-% IV SOLN
INTRAVENOUS | Status: AC
  Administered 2021-05-13: 333 mL via INTRAVENOUS
  Filled 2021-05-13: qty 500

## 2021-05-13 MED ORDER — TRANEXAMIC ACID-NACL 1000-0.7 MG/100ML-% IV SOLN
INTRAVENOUS | Status: AC
  Administered 2021-05-13: 1000 mg via INTRAVENOUS
  Filled 2021-05-13: qty 100

## 2021-05-13 MED ORDER — ACETAMINOPHEN 325 MG PO TABS
650.0000 mg | ORAL_TABLET | ORAL | Status: DC | PRN
  Administered 2021-05-13: 650 mg via ORAL
  Filled 2021-05-13: qty 2

## 2021-05-13 MED ORDER — WITCH HAZEL-GLYCERIN EX PADS: 1.0000 "application " | MEDICATED_PAD | CUTANEOUS | Status: DC | PRN

## 2021-05-13 MED ORDER — SIMETHICONE 80 MG PO CHEW: 80.0000 mg | CHEWABLE_TABLET | ORAL | Status: DC | PRN

## 2021-05-13 MED ORDER — ACETAMINOPHEN 325 MG PO TABS
650.0000 mg | ORAL_TABLET | ORAL | Status: DC | PRN
  Administered 2021-05-13 – 2021-05-14 (×3): 650 mg via ORAL
  Filled 2021-05-13 (×3): qty 2

## 2021-05-13 MED ORDER — TRANEXAMIC ACID-NACL 1000-0.7 MG/100ML-% IV SOLN: 1000.0000 mg | INTRAVENOUS | Status: AC

## 2021-05-13 MED ORDER — LACTATED RINGERS IV SOLN: INTRAVENOUS | Status: DC

## 2021-05-13 MED ORDER — OXYTOCIN BOLUS FROM INFUSION: 333.0000 mL | Freq: Once | INTRAVENOUS | Status: AC

## 2021-05-13 MED ORDER — IBUPROFEN 600 MG PO TABS
600.0000 mg | ORAL_TABLET | Freq: Once | ORAL | Status: DC
Start: 2021-05-13 — End: 2021-05-13

## 2021-05-13 MED ORDER — SENNOSIDES-DOCUSATE SODIUM 8.6-50 MG PO TABS
2.0000 | ORAL_TABLET | Freq: Every day | ORAL | Status: DC
  Administered 2021-05-14: 2 via ORAL
  Filled 2021-05-13: qty 2

## 2021-05-13 MED ORDER — NIFEDIPINE ER OSMOTIC RELEASE 30 MG PO TB24
30.0000 mg | ORAL_TABLET | Freq: Every day | ORAL | Status: DC
  Administered 2021-05-13 – 2021-05-14 (×2): 30 mg via ORAL
  Filled 2021-05-13 (×2): qty 1

## 2021-05-13 MED ORDER — BENZOCAINE-MENTHOL 20-0.5 % EX AERO: 1.0000 "application " | INHALATION_SPRAY | CUTANEOUS | Status: DC | PRN

## 2021-05-13 MED ORDER — MEDROXYPROGESTERONE ACETATE 150 MG/ML IM SUSP: 150.0000 mg | INTRAMUSCULAR | Status: DC | PRN

## 2021-05-13 MED ORDER — PRENATAL MULTIVITAMIN CH
1.0000 | ORAL_TABLET | Freq: Every day | ORAL | Status: DC
  Administered 2021-05-13: 1 via ORAL
  Filled 2021-05-13: qty 1

## 2021-05-13 MED ORDER — LIDOCAINE HCL (PF) 1 % IJ SOLN: 30.0000 mL | INTRAMUSCULAR | Status: DC | PRN

## 2021-05-13 MED ORDER — DIPHENHYDRAMINE HCL 25 MG PO CAPS: 25.0000 mg | ORAL_CAPSULE | Freq: Four times a day (QID) | ORAL | Status: DC | PRN

## 2021-05-13 MED ORDER — OXYCODONE-ACETAMINOPHEN 5-325 MG PO TABS: 2.0000 | ORAL_TABLET | ORAL | Status: DC | PRN

## 2021-05-13 MED ORDER — MISOPROSTOL 200 MCG PO TABS
800.0000 ug | ORAL_TABLET | Freq: Once | ORAL | Status: AC
  Administered 2021-05-13: 1000 ug via RECTAL

## 2021-05-13 MED ORDER — DIBUCAINE (PERIANAL) 1 % EX OINT: 1.0000 "application " | TOPICAL_OINTMENT | CUTANEOUS | Status: DC | PRN

## 2021-05-13 MED ORDER — ONDANSETRON HCL 4 MG/2ML IJ SOLN: 4.0000 mg | INTRAMUSCULAR | Status: DC | PRN

## 2021-05-13 MED ORDER — IBUPROFEN 600 MG PO TABS
600.0000 mg | ORAL_TABLET | Freq: Four times a day (QID) | ORAL | Status: DC
  Administered 2021-05-13 – 2021-05-14 (×5): 600 mg via ORAL
  Filled 2021-05-13 (×5): qty 1

## 2021-05-13 NOTE — Social Work (Addendum)
Per chart review, MOB UDS was positive for cocaine, opiates, and THC on 11/9.   Pediatrician made CSW aware of concerns. MOB reported to the pediatrician that she last used Subutex and oxycodone on yesterday (11/21) and she does not have a prescription for the substances. The infant was removed from the room with MOB and placed in the nursery due to concerns that MOB has not cared for infant as evidenced by RN assuming care for the infant.  Per OBGYN notes, MOB does not have custody of three of her children.    CSW made a report to [facility] CPS.   CSW will complete a full assessment with MOB tomorrow.   Vivi Barrack, MSW, LCSW Women's and [facility]  Clinical Social Worker  [phone] 2021/04/25  3:01 PM

## 2021-05-13 NOTE — Progress Notes (Signed)
Sheriff served [employer] revoking parental rights of older son this evening. Per sheriff, [place] CPS drove this evening and delivered paperwork to sheriff to deliver.   Copy of report in chart. Message left on voicemail of SW Vivi Barrack to update her on situation this evening.

## 2021-05-13 NOTE — Social Work (Signed)
CSW escorted [facility] social worker Velva Harman [phone]) to MOB room to complete an assessment. MOB refused to speak with CPS. Per the social worker, CPS is actively working to determine a disposition. CPS agreed to provide updates when a discharge plan has been decided.   Pediatrician and nurse provided updated.   Barriers to Discharge.   Vivi Barrack, MSW, LCSW [facility] and [facility]  Clinical Social Worker  [phone] 05/13/2021  5:29 PM

## 2021-05-13 NOTE — H&P (Signed)
OBSTETRIC ADMISSION HISTORY AND PHYSICAL  Davonda D Kartes is a 30 y.o. female [phone] with IUP at [redacted]w[redacted]d by LMP presenting for active labor/SROM. She reports +FMs,  no VB, no blurry vision, headaches or peripheral edema, and RUQ pain.  She received her prenatal care at  [facility]  (only one visit on 11/9)  Dating: By LMP --->  Estimated Date of Delivery: 05/29/21  Sono:   @[redacted]w[redacted]d , CWD, normal anatomy(although limited due to advanced gestational age), cephalic presentation, posterior placental lie, 2551g, 22% EFW   Prenatal History/Complications:  Late to prenatal care History of substance use History of preterm birth Chronic anemia  Past Medical History: Past Medical History:  Diagnosis Date   Closed fracture of proximal tibia 07/02/2013   Pregnant     Past Surgical History: Past Surgical History:  Procedure Laterality Date   FOREIGN BODY REMOVAL Right 07/02/2013   Procedure: REMOVAL FOREIGN BODY PELVIS;  Surgeon: 08/30/2013, MD;  Location: [facility] OR;  Service: Orthopedics;  Laterality: Right;   MOUTH SURGERY     TIBIA IM NAIL INSERTION Right 07/02/2013   Procedure: INTRAMEDULLARY (IM) NAIL TIBIAL;  Surgeon: 08/30/2013, MD;  Location: [facility] OR;  Service: Orthopedics;  Laterality: Right;    Obstetrical History: OB History     Gravida  7   Para  5   Term  2   Preterm  3   AB  1   Living  6      SAB      IAB  1   Ectopic      Multiple      Live Births              Social History Social History   Socioeconomic History   Marital status: Single    Spouse name: Not on file   Number of children: Not on file   Years of education: Not on file   Highest education level: Not on file  Occupational History   Not on file  Tobacco Use   Smoking status: Every Day    Packs/day: 0.50    Types: Cigarettes   Smokeless tobacco: Never  Vaping Use   Vaping Use: Never used  Substance and Sexual Activity   Alcohol use: No   Drug use: Yes    Types: Marijuana, IV,  Other-see comments, Hydrocodone    Comment: Subutex,   Sexual activity: Yes    Birth control/protection: None  Other Topics Concern   Not on file  Social History Narrative   Not on file   Social Determinants of Health   Financial Resource Strain: Not on file  Food Insecurity: Not on file  Transportation Needs: Not on file  Physical Activity: Not on file  Stress: Not on file  Social Connections: Not on file    Family History: Family History  Problem Relation Age of Onset   Breast cancer Mother    Hypertension Father    Breast cancer Maternal Grandmother    Heart murmur Daughter     Allergies: No Known Allergies  Medications Prior to Admission  Medication Sig Dispense Refill Last Dose   ferrous sulfate 325 (65 FE) MG tablet Take 1 tablet (325 mg total) by mouth every other day. 30 tablet 3    Prenatal Vit-Fe Fumarate-FA (MULTIVITAMIN-PRENATAL) 27-0.8 MG TABS tablet Take 1 tablet by mouth daily at 12 noon.        Review of Systems   All systems reviewed and negative except as stated in  HPI  Last menstrual period 09/11/2020, unknown if currently breastfeeding. General appearance: uncomfortable while in active labor and completely dilated Lungs: clear to auscultation bilaterally Heart: regular rate and rhythm Abdomen: soft, non-tender; bowel sounds normal Extremities: Homans sign is negative, no sign of DVT Presentation: cephalic Fetal monitoring: precipitous delivery once in L&D, no monitoring done Uterine activity: frequent, imminently delivering Dilation: 10 Effacement (%): 100 Station: Plus 1 Exam by:: Fabiola Backer, RN   Prenatal labs: ABO, Rh: A/Positive/-- (11/11 0854) Antibody: Negative (11/11 0854) Rubella: 2.21 (11/11 0854) RPR: Non Reactive (11/11 0854)  HBsAg: Negative (11/11 0854)  HIV: Non Reactive (11/11 0854)  GBS: Negative/-- (11/09 1630)  Glucola: not done - ordered but not done yet. A1C normal on 11/11 Genetic screening  established  care too late for genetic screening Anatomy US normal  Prenatal Transfer Tool  Maternal Diabetes: No Genetic Screening: Not done due to delayed presentation to care Maternal Ultrasounds/Referrals: Normal Fetal Ultrasounds or other Referrals:  None Maternal Substance Abuse:  Yes:  Type: Marijuana, Cocaine, Other: Opiate (positive screen on 04/30/2021) Significant Maternal Medications:  None Significant Maternal Lab Results: Group B Strep negative  No results found for this or any previous visit (from the past 24 hour(s)).  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 [id number] at [redacted]w[redacted]d here for active labor and precipitous delivery  #Labor:Active labor, fully dilated, delivered precipitously once in L&D room #ID:  GBS neg #MOF: need to confirm #MOC:plans BTL (signed papers on 11/9)- will need interval tubal since not 30 days yet since delivery and her gestational age is >37weeks. Will discuss bridging and interval BTL options #Circ:  Will discuss further  #Hep C Dx at new OB on 04/30/2021 -CMET at admission -ID referral outpatient if CMET normal  #Hx of substance use - UDS at initial OB with cocaine, cannabis, and opiates - Social work consult - will further screen for recent use once patient settled in room   Warner Mccreedy, MD, MPH OB Fellow, Faculty Practice

## 2021-05-13 NOTE — Discharge Summary (Signed)
Postpartum Discharge Summary  **Patient left AGAINST MEDICAL ADVICE**     Patient Name: Brianna Maynard DOB: 05-Jul-1990 MRN: [id number]  Date of admission: 05/13/2021 Delivery date:05/13/2021  Delivering provider: Renard Matter  Date of discharge: 05/14/2021  Admitting diagnosis: Pregnancy [Z34.90] Intrauterine pregnancy: [redacted]w[redacted]d    Secondary diagnosis:  Active Problems:   Cocaine abuse (HRand   Encounter for supervision of normal pregnancy, antepartum   Prenatal care insufficient   Acute hepatitis C complicating pregnancy, antepartum   Anemia affecting pregnancy  Additional problems: Left AGAINST MEDICAL ADVICE    Discharge diagnosis: Term Pregnancy Delivered and Anemia                                              Post partum procedures: None Augmentation: N/A Complications: None  Hospital course: Onset of Labor With Vaginal Delivery      30y.o. yo [id number] admitted in Active Labor on 05/13/2021. Patient had an uncomplicated labor course as follows:  Membrane Rupture Time/Date: 4:00 AM ,05/13/2021   Delivery Method:Vaginal, Spontaneous  Episiotomy: None  Lacerations:  1st degree  Patient had an uncomplicated postpartum course. She was started on procardia XL for postpartum elevated blood pressures on PPD#1. On PPD#1, she stated that she would be visiting the NICU and assisting FOB with his ID, however she then left and did not return to mother baby or NICU floor for the remainder of the day. Left AMA. Her blood pressure medication was sent to her pharmacy to pick up. She was ambulating, tolerating a regular diet, passing flatus, and urinating well when evaluated in the morning.   Newborn Data: Birth date:05/13/2021  Birth time:4:26 AM  Gender:Female  Living status:Living  Apgars:7 ,8  Weight:2510 g   Magnesium Sulfate received: No BMZ received: No Rhophylac:N/A MMR:N/A T-DaP:Given prenatally Flu: No Transfusion:No  Physical exam  Vitals:    05/13/21 1316 05/13/21 1710 05/13/21 2136 05/14/21 0459  BP: (!) 148/80 139/78 137/88 125/77  Pulse: 81 92 81 72  Resp: 20 (!) 22 20 18   Temp: 98.4 F (36.9 C)  98.5 F (36.9 C) 97.8 F (36.6 C)  TempSrc: Axillary  Oral Oral  SpO2: 99% 99% 100% 100%   Exam by Dr DaCy Blamern 11/23: General: alert, cooperative, and no distress Lochia: appropriate Uterine Fundus: firm Incision: N/A DVT Evaluation: No significant calf/ankle edema. Labs: Lab Results  Component Value Date   WBC 11.5 (H) 05/13/2021   HGB 10.5 (L) 05/13/2021   HCT 31.8 (L) 05/13/2021   MCV 84.6 05/13/2021   PLT 300 05/13/2021   CMP Latest Ref Rng & Units 05/13/2021  Glucose 70 - 99 mg/dL 108(H)  BUN 6 - 20 mg/dL 8  Creatinine 0.44 - 1.00 mg/dL 0.44  Sodium 135 - 145 mmol/L 135  Potassium 3.5 - 5.1 mmol/L 3.7  Chloride 98 - 111 mmol/L 102  CO2 22 - 32 mmol/L 23  Calcium 8.9 - 10.3 mg/dL 8.5(L)  Total Protein 6.5 - 8.1 g/dL 5.9(L)  Total Bilirubin 0.3 - 1.2 mg/dL 0.4  Alkaline Phos 38 - 126 U/L 290(H)  AST 15 - 41 U/L 32  ALT 0 - 44 U/L 36   Edinburgh Score: Edinburgh Postnatal Depression Scale Screening Tool 05/13/2021  I have been able to laugh and see the funny side of things. (No Data)  After visit meds:  Allergies as of 05/14/2021   No Known Allergies      Medication List     TAKE these medications    acetaminophen 325 MG tablet Commonly known as: Tylenol Take 2 tablets (650 mg total) by mouth every 4 (four) hours as needed (for pain scale < 4).   ferrous sulfate 325 (65 FE) MG tablet Take 1 tablet (325 mg total) by mouth every other day.   ibuprofen 600 MG tablet Commonly known as: ADVIL Take 1 tablet (600 mg total) by mouth every 6 (six) hours.   multivitamin-prenatal 27-0.8 MG Tabs tablet Take 1 tablet by mouth daily at 12 noon.   NIFEdipine 30 MG 24 hr tablet Commonly known as: ADALAT CC Take 1 tablet (30 mg total) by mouth daily. For blood pressure Start taking on: May 15, 2021         Discharge home in stable condition Infant Feeding: Bottle Infant Disposition:NICU Discharge instruction: per After Visit Summary and Postpartum booklet. Activity: Advance as tolerated. Pelvic rest for 6 weeks.  Diet: routine diet Future Appointments: Future Appointments  Date Time Provider [place]  05/28/2021 11:30 AM Myrtis Ser, CNM [facility]  06/17/2021  1:30 PM Janyth Pupa, DO [facility]   Follow up Visit: Message sent to [facility] by Dr. Cy Blamer on 05/13/2021 Please schedule this patient for a In person postpartum visit in 4 weeks with the following provider: Any provider. Additional Postpartum F/U:Postpartum Depression checkup and social check in   High risk pregnancy complicated by:  limited prenatal care, late prenatal care, substance use in pregnancy Delivery mode:  Vaginal, Spontaneous  Anticipated Birth Control:   desires interval BTL   05/14/2021 Patriciaann Clan, DO

## 2021-05-14 MED ORDER — NIFEDIPINE ER 30 MG PO TB24: 30.0000 mg | ORAL_TABLET | Freq: Every day | ORAL | 0 refills | Status: DC

## 2021-05-14 MED ORDER — IBUPROFEN 600 MG PO TABS: 600.0000 mg | ORAL_TABLET | Freq: Four times a day (QID) | ORAL | 0 refills | Status: DC

## 2021-05-14 MED ORDER — ACETAMINOPHEN 325 MG PO TABS: 650.0000 mg | ORAL_TABLET | ORAL | Status: AC | PRN

## 2021-05-14 NOTE — Progress Notes (Signed)
POSTPARTUM PROGRESS NOTE  Subjective: Brianna Maynard is a 30 y.o. [id number] PPD#1 s/p SVD at [redacted]w[redacted]d.  She reports she doing well. No acute events overnight. She denies any problems with ambulating, voiding or po intake. Denies nausea or vomiting. She has  passed flatus. Pain is well controlled.  Lochia is scant.  Reports she would like to do depo bridge to interval tubal (previously signed tubal paperwork but less than 4 weeks before delivery)  Objective: Blood pressure 125/77, pulse 72, temperature 97.8 F (36.6 C), temperature source Oral, resp. rate 18, last menstrual period 09/11/2020, SpO2 100 %, unknown if currently breastfeeding.  Physical Exam:  General: alert, cooperative and no distress Chest: no respiratory distress Abdomen: soft, non-tender  Uterine Fundus: firm, appropriately tender Extremities: No calf swelling or tenderness  no edema  Recent Labs    05/13/21 0625  HGB 10.5*  HCT 31.8*    Assessment/Plan: Brianna Maynard is a 30 y.o. [id number] PPD#1 s/p SVD at [redacted]w[redacted]d.  Routine Postpartum Care: Doing well, pain well-controlled.  -- Continue routine care, lactation support  -- Contraception: plans Depo -- Feeding: bottle (baby in NICU)  #Acute blood loss anemia HgB 10.5 Will start on PO iron  Dispo: Plan for discharge PPD#2 per patient preference  Warner Mccreedy, MD, MPH OB Fellow, [facility]

## 2021-05-14 NOTE — Plan of Care (Signed)
Patient not in hospital for 6 hours.  MD notified. Discharged AMA per [facility]

## 2021-05-14 NOTE — Social Work (Signed)
[facility] [employer] social worker Velva Harman sent over the Verification of Custody Order. MOB can no longer visit with the infant and MOB can no longer receive updates regarding the infant's care. The Verification of Custody Order was placed in the infant's chart. The infant will discharge to foster care parents:  Earna Coder and Salvatore Decent at [address] [place], [place] [address].   Infant and MOB nurse were notified.  AC was made aware.    Vivi Barrack, MSW, LCSW [facility]  Clinical Social Worker  [phone] 05/14/2021  4:14 PM
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
## 2021-05-14 NOTE — Lactation Note (Signed)
This note was copied from a baby's chart. Lactation Consultation Note Breastfeeding/mom's on milk is contraindicated for this infant. LC team will follow up for lactation maternal health needs:prn only.   Patient Name: Brianna Maynard DHWYS'H Date: 05/14/2021   Consult Status Consult Status: Complete   Elder Negus 05/14/2021, 8:42 AM

## 2021-05-14 NOTE — Plan of Care (Signed)
Patient left to visit NICU at 1030.  NICU reports patient stayed for a few minutes and then stated she was leaving with FOB to get his ID.  Patient has not returned to floor or NICU.  Attempted to call number on file.  No answer.  Updated management and MD.

## 2021-05-21 ENCOUNTER — Encounter: Payer: Medicaid Other | Admitting: Advanced Practice Midwife

## 2021-05-27 ENCOUNTER — Telehealth (HOSPITAL_COMMUNITY): Payer: Self-pay | Admitting: *Deleted

## 2021-05-27 NOTE — Telephone Encounter (Signed)
Attempted Hospital Discharge Follow-Up Call.  No voice mail.  Unable to leave a message.

## 2021-05-28 ENCOUNTER — Ambulatory Visit: Payer: Medicaid Other | Admitting: Advanced Practice Midwife

## 2021-06-17 ENCOUNTER — Ambulatory Visit: Payer: Medicaid Other | Admitting: Obstetrics & Gynecology

## 2021-07-14 ENCOUNTER — Ambulatory Visit: Payer: Medicaid Other | Admitting: Women's Health

## 2022-05-12 ENCOUNTER — Other Ambulatory Visit: Payer: Self-pay | Admitting: *Deleted

## 2022-05-12 ENCOUNTER — Ambulatory Visit: Payer: Medicaid Other | Admitting: *Deleted

## 2022-05-12 ENCOUNTER — Ambulatory Visit (INDEPENDENT_AMBULATORY_CARE_PROVIDER_SITE_OTHER): Payer: Medicaid Other

## 2022-05-12 VITALS — BP 112/73 | HR 83 | Ht 64.0 in | Wt 121.0 lb

## 2022-05-12 DIAGNOSIS — O99320 Drug use complicating pregnancy, unspecified trimester: Secondary | ICD-10-CM

## 2022-05-12 DIAGNOSIS — F1910 Other psychoactive substance abuse, uncomplicated: Secondary | ICD-10-CM

## 2022-05-12 DIAGNOSIS — O0990 Supervision of high risk pregnancy, unspecified, unspecified trimester: Secondary | ICD-10-CM | POA: Diagnosis not present

## 2022-05-12 MED ORDER — ASPIRIN 81 MG PO TBEC: 81.0000 mg | DELAYED_RELEASE_TABLET | Freq: Every day | ORAL | 2 refills | Status: DC

## 2022-05-12 MED ORDER — ASPIRIN 81 MG PO TBEC
81.0000 mg | DELAYED_RELEASE_TABLET | Freq: Every day | ORAL | 12 refills | Status: AC
Start: 2022-05-12 — End: ?

## 2022-05-12 NOTE — Progress Notes (Signed)
New OB Intake  I connected withNAME@ on 05/12/22 at  1:10 PM EST by In Person Visit and verified that I am speaking with the correct person using two identifiers. Nurse is located at [facility] and pt is located at [place].  I discussed the limitations, risks, security and privacy concerns of performing an evaluation and management service by telephone and the availability of in person appointments. I also discussed with the patient that there may be a patient responsible charge related to this service. The patient expressed understanding and agreed to proceed.  I explained I am completing New OB Intake today. We discussed EDD of 11/20/22 that is based [place] on 05/12/22 at [redacted]w[redacted]d. Pt is G7/P5. I reviewed her allergies, medications, Medical/Surgical/OB history, and appropriate screenings. I informed her of [facility] services. [facility] information placed in AVS. Based on history, this is a high risk pregnancy.  Patient Active Problem List   Diagnosis Date Noted   Cocaine abuse (HCC) 04/30/2021   Methamphetamine abuse (HCC) 04/30/2021   History of preterm delivery 04/30/2021    Concerns addressed today  Delivery Plans Plans to deliver at [facility]. Patient given information for [facility] Healthy Baby website for more information about [facility]. Patient not a candidate for water birth.  MyChart/Babyscripts MyChart access verified. I explained pt will have some visits in office and some virtually. Babyscripts instructions given and order placed. Patient verifies receipt of registration text/e-mail. Account successfully created and app downloaded.  Blood Pressure Cuff/Weight Scale Pt is incarcerated. Medical care in [facility]. Explained after first prenatal appt pt will check weekly and document in Babyscripts. Patient does not have weight scale; patient may purchase if they desire to track weight weekly in Babyscripts.  Anatomy US Explained first scheduled [place] will be  around 14 weeks for cervical length. Anatomy US scheduled for 19 wks at MFM. Pt notified to arrive at [facility].  Labs Prenatal labs with exception of RPR and UDS performed at [facility]. RPR and UDS collected today.  COVID Vaccine Patient has had COVID vaccine.  Social Determinants of Health Food Insecurity: Patient denies food insecurity. WIC Referral: Patient is interested in referral to [facility].  Transportation: Patient denies transportation needs. Childcare: Discussed no children allowed at ultrasound appointments. Offered childcare services; patient declines childcare services at this time.  First visit review I reviewed new OB appt with patient. I explained they will have a provider visit that includes pelvic exam. Explained pt will be seen by Dr. Briscoe Deutscher at first visit; encounter routed to appropriate provider. Explained that patient will be seen by pregnancy navigator following visit with provider.   Harrel Lemon, RN 05/12/2022  1:10 PM

## 2022-05-12 NOTE — Progress Notes (Signed)
Order for UDS changed from POCT UDS to Tox Assure 13 per recommendation Dr. Tamera Punt.

## 2022-05-13 LAB — RPR: RPR Ser Ql: NONREACTIVE

## 2022-05-16 LAB — TOXASSURE SELECT 13 (MW), URINE

## 2022-05-27 ENCOUNTER — Ambulatory Visit: Payer: Medicaid Other

## 2022-05-27 DIAGNOSIS — O0990 Supervision of high risk pregnancy, unspecified, unspecified trimester: Secondary | ICD-10-CM

## 2022-06-10 ENCOUNTER — Encounter: Payer: Medicaid Other | Admitting: Obstetrics & Gynecology

## 2022-06-10 DIAGNOSIS — O0990 Supervision of high risk pregnancy, unspecified, unspecified trimester: Secondary | ICD-10-CM

## 2022-06-25 ENCOUNTER — Encounter: Admitting: Family Medicine

## 2022-06-26 ENCOUNTER — Ambulatory Visit

## 2022-06-26 ENCOUNTER — Other Ambulatory Visit

## 2022-11-12 NOTE — Discharge Summary (Signed)
 ------------------------------------------------------------------------------- Attestation signed by Brianna Suzen Browning, MD at 11/13/22 [phone] I have seen and evaluated this patient on the day of discharge.  I agree with the plan of care as noted below.  Bps normotensive currently.  Vitals:   11/12/22 0757  BP: 126/66  Pulse: 79  Resp: 18  Temp: 36.9 C (98.4 F)  SpO2:     Less than 30 minutes spent in total discharge planning.  Suzen HERO. [name], MD Nov 13, 2022 8:03 AM   -------------------------------------------------------------------------------  Obstetrics Discharge Summary  Admit Date: 11/09/2022  Discharge Date and Time: Nov 12, 2022 8:37 AM  Discharge To: [facility]  Discharge Attending Physician: Suzen [name], MD  Delivery Type: Vaginal, Spontaneous   Gestational Age at Delivery:  Information for the patient's newborn:  Brianna, Maynard [[id number]]  Gestational Age: [redacted]w[redacted]d   Delivery Clinician: MARIJEAN HEIDELBERG HOPE E   Delivery Anesthesia: Epidural   Labor Complications: None   Lacerations: None   Other Procedures: None   Postpartum Complications: Elevated BP  Hospital Problems Addressed During this Hospitalization: Principal Problem:   Chronic hypertension with superimposed preeclampsia Active Problems:   Opiate use   Substance abuse affecting pregnancy in second trimester, antepartum (CMS-HCC)   Echogenic bowel of fetus on prenatal ultrasound   Vaginal delivery  Birth Control Planned at Discharge: received Depo shot prior to discharge  Infant Feeding Method at Discharge: L&D Feeding Options: formula  Hospital Course:  The patient was admitted to labor and delivery on 11/09/2022 for IOL due to [facility]. She delivered on 5/20 at [redacted]w[redacted]d, and her delivery was uncomplicated. Her pregnancy and postpartum course are detailed by problems below.   Chronic Hypertension > SIPE Patient has known chronic hypertension and was not on any  medications prior to admission. Admit HELLP labs were notable for an AST 69/ ALT 108. UPC 0.551. Other HELLP labs were WNL. Of note, the patient has active HCV. She was extensively counseled about magnesium  and in shared-decision making, opted to defer it. She did have intermittent mild-range blood pressures on PPD#2, and Procardia  30 mg was started with good effect. She remained asymptomatic during the postpartum period. She will have a 1-week BP check and repeat labs in 1 week at [facility].   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 This dose was continued postpartum with good effect.   HCV Infection Admit VL was 8,[phone],634. LFTs elevated on admission as detailed above. She will be referred for treatment in the postpartum period.   Anemia Admit Hgb was 10.2. She was continued on PO iron every other day.   Abnormal Pap Smear Pregnancy pap smear was ASCUS HPV pos. Colposcopic biopsy demonstrated CIN 1. She will need repeat co-testing postpartum.  On PPD#3, she was meeting all of her postpartum goals. She will be discharged to [facility] and follow up for a 1-week BP check and a 4-6 week postpartum visit.    Day of Discharge Services: Patient was seen and examined by the primary team and deemed stable for discharge.  BP 126/66   Pulse 79   Temp 36.9 C (98.4 F) (Oral)   Resp 18   LMP  (LMP Unknown)   SpO2 99%   Breastfeeding No  General: No acute distress Respiratory: Normal work of breathing Abdomen: soft, non-distended, fundus firm below the umbilicus, nontender to palpation GU: minimal vaginal bleeding Extremities: no calf pain bilaterally, trace edema bilaterally  Condition at Discharge: good  Pertinent Labs: HELLP  No results  found for requested labs within last 2 days.  [  CBC  No results found for requested labs within last 2 days.    Lab Results  Component Value Date   WBC 6.0 11/09/2022   HGB 10.2 (L) 11/09/2022   HCT 30.8 (L) 11/09/2022   PLT 223 11/09/2022     Invalid input(s): POCTBG  Pending Test Results:   Discharge Medications:    Your Medication List     STOP taking these medications    progesterone 200 MG capsule Commonly known as: PROMETRIUM       START taking these medications    acetaminophen  325 MG tablet Commonly known as: TYLENOL  Take 2 tablets (650 mg total) by mouth every six (6) hours as needed for pain for up to 30 doses.   ibuprofen  600 MG tablet Commonly known as: MOTRIN  Take 1 tablet (600 mg total) by mouth every six (6) hours as needed for pain for up to 30 doses.   NIFEdipine  30 MG 24 hr tablet Commonly known as: PROCARDIA  XL Take 1 tablet (30 mg total) by mouth daily for 30 doses.   polyethylene glycol 17 gram packet Commonly known as: MIRALAX  Take 17 g by mouth daily.   senna 8.6 mg tablet Commonly known as: SENOKOT Take 1 tablet by mouth nightly as needed for constipation.   simethicone  80 MG chewable tablet Commonly known as: MYLICON Chew 1 tablet (80 mg total) four (4) times a day as needed.         Immunizations: Immunization History  Administered Date(s) Administered  . TdaP 11/30/2008, 10/13/2018    Discharge Instructions: Activity Instructions     Activity as tolerated        Diet Instructions     Discharge diet (specify)     Discharge Nutrition Therapy: Regular      Follow Up instructions and Outpatient Referrals    Call OB Provider for: Temperature > 38.0 Celsius ( > 100.4 Fahrenheit)     Call OB Provider for: difficulty breathing, headache or visual  disturbances     Call OB Provider for: hives     Call OB Provider for: persistent dizziness or light-headedness     Call OB Provider for: persistent nausea or vomiting     Call OB Provider for: redness, tenderness, or signs of infection (pain,  swelling, redness, odor or green/yellow discharge around incision site)     Call [facility] Provider for: severe uncontrolled pain     Call OB Provider for: vaginal bleeding  saturating more than 1 pad per  hour.     Call OB for: extreme fatigue     Discharge instructions

## 2023-05-29 ENCOUNTER — Emergency Department (HOSPITAL_COMMUNITY)
Admission: EM | Admit: 2023-05-29 | Discharge: 2023-05-30 | Disposition: A | Discharge status: Home / Self Care | Attending: Emergency Medicine | Admitting: Emergency Medicine

## 2023-05-29 ENCOUNTER — Encounter (HOSPITAL_COMMUNITY): Payer: Self-pay | Admitting: *Deleted

## 2023-05-29 ENCOUNTER — Other Ambulatory Visit: Payer: Self-pay

## 2023-05-29 DIAGNOSIS — L0291 Cutaneous abscess, unspecified: Secondary | ICD-10-CM

## 2023-05-29 DIAGNOSIS — L03315 Cellulitis of perineum: Secondary | ICD-10-CM | POA: Diagnosis not present

## 2023-05-29 DIAGNOSIS — N764 Abscess of vulva: Secondary | ICD-10-CM | POA: Diagnosis present

## 2023-05-29 NOTE — ED Triage Notes (Signed)
The pt has a swollen red area in her lower abdomen it has increased I size  it appears to ba an abscess  lmp now

## 2023-05-30 MED ORDER — DOXYCYCLINE HYCLATE 100 MG PO TABS
100.0000 mg | ORAL_TABLET | Freq: Once | ORAL | Status: AC
  Administered 2023-05-30: 100 mg via ORAL
  Filled 2023-05-30: qty 1

## 2023-05-30 MED ORDER — LIDOCAINE-EPINEPHRINE (PF) 2 %-1:200000 IJ SOLN
10.0000 mL | Freq: Once | INTRAMUSCULAR | Status: AC
  Administered 2023-05-30: 10 mL
  Filled 2023-05-30: qty 20

## 2023-05-30 MED ORDER — DOXYCYCLINE HYCLATE 100 MG PO CAPS: 100.0000 mg | ORAL_CAPSULE | Freq: Two times a day (BID) | ORAL | 0 refills | Status: AC

## 2023-05-30 MED ORDER — OXYCODONE-ACETAMINOPHEN 5-325 MG PO TABS: 1.0000 | ORAL_TABLET | Freq: Four times a day (QID) | ORAL | 0 refills | Status: AC | PRN

## 2023-05-30 MED ORDER — OXYCODONE-ACETAMINOPHEN 5-325 MG PO TABS
1.0000 | ORAL_TABLET | Freq: Once | ORAL | Status: AC
  Administered 2023-05-30: 1 via ORAL
  Filled 2023-05-30: qty 1

## 2023-05-30 NOTE — ED Provider Notes (Signed)
[facility] Emergency Department Provider Note MRN:  [id number]  Arrival date & time: 05/30/23     Chief Complaint   Abdominal Pain   History of Present Illness   Brianna Maynard is a 32 y.o. year-old female presents to the ED with chief complaint of abscess.  She states that she shaved her pubic area and noticed a hair bump about a day or two afterward.  She states that the area has become swollen and tender.  She denies fevers.  Denies DM.  Denies any treatments PTA.  Denies any other associated symptoms.  History provided by patient.   Review of Systems  Pertinent positive and negative review of systems noted in HPI.    Physical Exam   Vitals:   05/30/23 0047 05/30/23 0533  BP: 129/79 123/71  Pulse: 94 87  Resp: 16 18  Temp: 98.3 F (36.8 C) 99.2 F (37.3 C)  SpO2: 100% 100%    CONSTITUTIONAL:  non toxic-appearing, NAD NEURO:  Alert and oriented x 3, CN 3-12 grossly intact EYES:  eyes equal and reactive ENT/NECK:  Supple, no stridor  CARDIO:  normal rate, regular rhythm, appears well-perfused  PULM:  No respiratory distress,  GI/GU:  non-distended, developing abscess to the mons pubis (chaperone present for all sensitive parts of exam and procedure) MSK/SPINE:  No gross deformities, no edema, moves all extremities  SKIN:  no rash, atraumatic   *Additional and/or pertinent findings included in MDM below  Diagnostic and Interventional Summary    EKG Interpretation Date/Time:    Ventricular Rate:    PR Interval:    QRS Duration:    QT Interval:    QTC Calculation:   R Axis:      Text Interpretation:         Labs Reviewed  BODY FLUID CULTURE W GRAM STAIN    No orders to display    Medications  lidocaine-EPINEPHrine (XYLOCAINE W/EPI) 2 %-1:200000 (PF) injection 10 mL (has no administration in time range)  doxycycline (VIBRA-TABS) tablet 100 mg (has no administration in time range)  oxyCODONE-acetaminophen  (PERCOCET/ROXICET) 5-325 MG per tablet 1 tablet (has no administration in time range)     Procedures  /  Critical Care .Incision and Drainage  Date/Time: 05/30/2023 6:21 AM  Performed by: Roxy Horseman, PA-C Authorized by: Roxy Horseman, PA-C   Consent:    Consent obtained:  Verbal   Consent given by:  Patient   Risks discussed:  Bleeding, incomplete drainage, pain and damage to other organs   Alternatives discussed:  No treatment Universal protocol:    Procedure explained and questions answered to patient or proxy's satisfaction: yes     Relevant documents present and verified: yes     Test results available : yes     Imaging studies available: yes     Required blood products, implants, devices, and special equipment available: yes     Site/side marked: yes     Immediately prior to procedure, a time out was called: yes     Patient identity confirmed:  Verbally with patient Location:    Type:  Abscess Pre-procedure details:    Skin preparation:  Betadine Anesthesia:    Anesthesia method:  Local infiltration   Local anesthetic:  Lidocaine 1% WITH epi Procedure type:    Complexity:  Complex Procedure details:    Incision types:  Single straight   Incision depth:  Subcutaneous   Wound management:  Probed and deloculated, irrigated with saline and extensive  cleaning   Drainage:  Purulent   Drainage amount:  Moderate   Packing materials:  1/4 in gauze Post-procedure details:    Procedure completion:  Tolerated well, no immediate complications   ED Course and Medical Decision Making  I have reviewed the triage vital signs, the nursing notes, and pertinent available records from the EMR.  Social Determinants Affecting Complexity of Care: Patient has no clinically significant social determinants affecting this chief complaint..   ED Course:    Medical Decision Making Patient here with abscess to her mons pubis.  Shaved her pubic area a few days ago.  She has some  surrounding cellulitis.  I was able to drain the abscess at the bedside.  Will cover for cellulitis with doxy.  Culture sent to lab.  Return precautions discussed.  Patient is stable for discharge.  Amount and/or Complexity of Data Reviewed Labs: ordered.  Risk Prescription drug management.         Consultants: No consultations were needed in caring for this patient.   Treatment and Plan: Emergency department workup does not suggest an emergent condition requiring admission or immediate intervention beyond  what has been performed at this time. The patient is safe for discharge and has  been instructed to return immediately for worsening symptoms, change in  symptoms or any other concerns    Final Clinical Impressions(s) / ED Diagnoses     ICD-10-CM   1. Abscess  L02.91       ED Discharge Orders          Ordered    doxycycline (VIBRAMYCIN) 100 MG capsule  2 times daily        05/30/23 0623    oxyCODONE-acetaminophen (PERCOCET) 5-325 MG tablet  Every 6 hours PRN        05/30/23 6962              Discharge Instructions Discussed with and Provided to Patient:   Discharge Instructions   None      Roxy Horseman, PA-C 05/30/23 9528    Zadie Rhine, MD 05/30/23 [phone]

## 2023-06-01 LAB — BODY FLUID CULTURE W GRAM STAIN

## 2023-06-02 ENCOUNTER — Telehealth (HOSPITAL_BASED_OUTPATIENT_CLINIC_OR_DEPARTMENT_OTHER): Payer: Self-pay | Admitting: *Deleted

## 2023-06-02 NOTE — Telephone Encounter (Signed)
Post ED Visit - Positive Culture Follow-up  Culture report reviewed by antimicrobial stewardship pharmacist: [facility] [x]  Daylene Posey, Pharm.D. []  Celedonio Miyamoto, Pharm.D., BCPS AQ-ID []  Garvin Fila, Pharm.D., BCPS []  Georgina Pillion, [address].D., BCPS []  [place], [address].D., BCPS, AAHIVP []  Estella Husk, Pharm.D., BCPS, AAHIVP []  Lysle Pearl, PharmD, BCPS []  Phillips Climes, PharmD, BCPS []  Agapito Games, PharmD, BCPS []  Verlan Friends, PharmD []  Mervyn Gay, PharmD, BCPS []  Vinnie Level, PharmD  [facility] []  Len Childs, PharmD []  Greer Pickerel, PharmD []  Adalberto Cole, PharmD []  Perlie Gold, Rph []  Lonell Face) Jean Rosenthal, PharmD []  Earl Many, PharmD []  Junita Push, PharmD []  Dorna Leitz, PharmD []  Terrilee Files, PharmD []  Lynann Beaver, PharmD []  Keturah Barre, PharmD []  Loralee Pacas, PharmD []  Bernadene Person, PharmD   Positive wound culture Treated with doxycycline, organism sensitive to the same and no further patient follow-up is required at this time.  Nena Polio Garner Nash 06/02/2023, 12:04 PM

## 2023-12-12 ENCOUNTER — Encounter (HOSPITAL_COMMUNITY): Payer: Self-pay

## 2023-12-12 ENCOUNTER — Emergency Department (HOSPITAL_COMMUNITY)
Admission: EM | Admit: 2023-12-12 | Discharge: 2023-12-12 | Disposition: A | Discharge status: Home / Self Care | Payer: Self-pay | Attending: Emergency Medicine | Admitting: Emergency Medicine

## 2023-12-12 ENCOUNTER — Other Ambulatory Visit: Payer: Self-pay

## 2023-12-12 DIAGNOSIS — Z7982 Long term (current) use of aspirin: Secondary | ICD-10-CM | POA: Insufficient documentation

## 2023-12-12 LAB — COMPREHENSIVE METABOLIC PANEL WITH GFR
ALT: 93 U/L — ABNORMAL HIGH (ref 0–44)
AST: 40 U/L (ref 15–41)
Albumin: 3.1 g/dL — ABNORMAL LOW (ref 3.5–5.0)
Alkaline Phosphatase: 95 U/L (ref 38–126)
Anion gap: 12 (ref 5–15)
BUN: <5 mg/dL — ABNORMAL LOW (ref 6–20)
CO2: 24 mmol/L (ref 22–32)
Calcium: 8.9 mg/dL (ref 8.9–10.3)
Chloride: 99 mmol/L (ref 98–111)
Creatinine, Ser: <0.3 mg/dL — ABNORMAL LOW (ref 0.44–1.00)
Glucose, Bld: 107 mg/dL — ABNORMAL HIGH (ref 70–99)
Potassium: 3.4 mmol/L — ABNORMAL LOW (ref 3.5–5.1)
Sodium: 135 mmol/L (ref 135–145)
Total Bilirubin: 0.3 mg/dL (ref 0.0–1.2)
Total Protein: 7.2 g/dL (ref 6.5–8.1)

## 2023-12-12 LAB — CBC WITH DIFFERENTIAL/PLATELET
Abs Immature Granulocytes: 0.18 10*3/uL — ABNORMAL HIGH (ref 0.00–0.07)
Basophils Absolute: 0.1 10*3/uL (ref 0.0–0.1)
Basophils Relative: 0 %
Eosinophils Absolute: 0 10*3/uL (ref 0.0–0.5)
Eosinophils Relative: 0 %
HCT: 27.4 % — ABNORMAL LOW (ref 36.0–46.0)
Hemoglobin: 9.3 g/dL — ABNORMAL LOW (ref 12.0–15.0)
Immature Granulocytes: 1 %
Lymphocytes Relative: 7 %
Lymphs Abs: 2 10*3/uL (ref 0.7–4.0)
MCH: 28.5 pg (ref 26.0–34.0)
MCHC: 33.9 g/dL (ref 30.0–36.0)
MCV: 84 fL (ref 80.0–100.0)
Monocytes Absolute: 0.6 10*3/uL (ref 0.1–1.0)
Monocytes Relative: 2 %
Neutro Abs: 27.2 10*3/uL — ABNORMAL HIGH (ref 1.7–7.7)
Neutrophils Relative %: 90 %
Platelets: 263 10*3/uL (ref 150–400)
RBC: 3.26 MIL/uL — ABNORMAL LOW (ref 3.87–5.11)
RDW: 14 % (ref 11.5–15.5)
WBC: 30.1 10*3/uL — ABNORMAL HIGH (ref 4.0–10.5)
nRBC: 0 % (ref 0.0–0.2)

## 2023-12-12 LAB — HEPATITIS PANEL, ACUTE
HCV Ab: REACTIVE — AB
Hep A IgM: NONREACTIVE
Hep B C IgM: NONREACTIVE
Hepatitis B Surface Ag: NONREACTIVE

## 2023-12-12 LAB — RAPID HIV SCREEN (HIV 1/2 AB+AG)
HIV 1/2 Antibodies: NONREACTIVE
HIV-1 P24 Antigen - HIV24: NONREACTIVE

## 2023-12-12 LAB — ABO/RH: ABO/RH(D): A POS

## 2023-12-12 MED ORDER — FENTANYL CITRATE PF 50 MCG/ML IJ SOSY
PREFILLED_SYRINGE | INTRAMUSCULAR | Status: AC
  Administered 2023-12-12: 50 ug via INTRAVENOUS
  Filled 2023-12-12: qty 1

## 2023-12-12 MED ORDER — CARBOPROST TROMETHAMINE 250 MCG/ML IM SOLN: 250.0000 ug | Freq: Once | INTRAMUSCULAR | Status: DC | PRN

## 2023-12-12 MED ORDER — LABETALOL HCL 5 MG/ML IV SOLN: 10.0000 mg | Freq: Once | INTRAVENOUS | Status: AC

## 2023-12-12 MED ORDER — OXYTOCIN 10 UNIT/ML IJ SOLN
10.0000 [IU] | Freq: Once | INTRAMUSCULAR | Status: AC
  Administered 2023-12-12: 10 [IU] via INTRAMUSCULAR
  Filled 2023-12-12: qty 1

## 2023-12-12 MED ORDER — ONDANSETRON HCL 4 MG/2ML IJ SOLN
4.0000 mg | Freq: Once | INTRAMUSCULAR | Status: AC
Start: 2023-12-12 — End: 2023-12-12
  Administered 2023-12-12: 4 mg via INTRAVENOUS
  Filled 2023-12-12: qty 2

## 2023-12-12 MED ORDER — MAGNESIUM SULFATE 4 GM/100ML IV SOLN
4.0000 g | Freq: Once | INTRAVENOUS | Status: AC
  Administered 2023-12-12: 4 g via INTRAVENOUS
  Filled 2023-12-12: qty 100

## 2023-12-12 MED ORDER — FENTANYL CITRATE PF 50 MCG/ML IJ SOSY: 50.0000 ug | PREFILLED_SYRINGE | Freq: Once | INTRAMUSCULAR | Status: AC

## 2023-12-12 MED ORDER — LABETALOL HCL 5 MG/ML IV SOLN
INTRAVENOUS | Status: AC
  Administered 2023-12-12: 10 mg via INTRAVENOUS
  Filled 2023-12-12: qty 4

## 2023-12-12 MED ORDER — ONDANSETRON HCL 4 MG/2ML IJ SOLN
4.0000 mg | Freq: Once | INTRAMUSCULAR | Status: AC
  Administered 2023-12-12: 4 mg via INTRAVENOUS

## 2023-12-12 MED ORDER — ONDANSETRON HCL 4 MG/2ML IJ SOLN
4.0000 mg | Freq: Once | INTRAMUSCULAR | Status: DC
  Filled 2023-12-12: qty 2

## 2023-12-12 NOTE — ED Triage Notes (Signed)
 Pt eight months pregnant complaining of laboring pains.

## 2023-12-12 NOTE — Care Plan (Signed)
 Progressing

## 2023-12-12 NOTE — ED Provider Notes (Signed)
 [place] EMERGENCY DEPARTMENT AT [facility]  Provider Note  CSN: [id number] Arrival date & time: 12/12/23 0557  History Chief Complaint  Patient presents with   Laboring    Brianna Maynard is a 33 y.o. female G8P7 at unknown gestational age presents in labor, started a few hours ago. No prenatal care.    Home Medications Prior to Admission medications   Medication Sig Start Date End Date Taking? Authorizing Provider  acetaminophen  (TYLENOL ) 325 MG tablet Take 2 tablets (650 mg total) by mouth every 4 (four) hours as needed (for pain scale < 4). Patient not taking: Reported on 05/12/2022 05/14/21   Jarrett Lucie SAILOR, DO  aspirin  EC 81 MG tablet Take 1 tablet (81 mg total) by mouth daily. Swallow whole. 05/12/22   Zina Jerilynn LABOR, MD  docusate (COLACE) 50 MG/5ML liquid Take 50 mg by mouth daily.    [provider]  doxycycline  (VIBRAMYCIN ) 100 MG capsule Take 1 capsule (100 mg total) by mouth 2 (two) times daily. 05/30/23   Vicky Charleston, PA-C  ferrous sulfate  325 (65 FE) MG tablet Take 1 tablet (325 mg total) by mouth every other day. 05/06/21   Loreli Suzen BIRCH, CNM  oxyCODONE -acetaminophen  (PERCOCET) 5-325 MG tablet Take 1-2 tablets by mouth every 6 (six) hours as needed. 05/30/23   Vicky Charleston, PA-C  Prenatal Vit-Fe Fumarate-FA (MULTIVITAMIN-PRENATAL) 27-0.8 MG TABS tablet Take 1 tablet by mouth daily at 12 noon.    [provider]     Allergies    Patient has no known allergies.   Review of Systems   Review of Systems Please see HPI for pertinent positives and negatives  Physical Exam BP (!) 144/81 (BP Location: Left Arm)   Pulse 73   Temp 97.9 F (36.6 C) (Oral)   Resp (!) 22   Ht 5' 4 (1.626 m)   Wt 63 kg   LMP 05/29/2023   SpO2 98%   BMI 23.86 kg/m   Physical Exam Vitals and nursing note reviewed. Exam conducted with a chaperone present.  Constitutional:      General: She is not in acute distress.    Appearance:  Normal appearance.  HENT:     Head: Normocephalic and atraumatic.     Nose: Nose normal.     Mouth/Throat:     Mouth: Mucous membranes are moist.   Eyes:     Extraocular Movements: Extraocular movements intact.     Conjunctiva/sclera: Conjunctivae normal.    Cardiovascular:     Rate and Rhythm: Normal rate.  Pulmonary:     Effort: Pulmonary effort is normal.     Breath sounds: Normal breath sounds.  Abdominal:     Comments: gravid   Musculoskeletal:        General: No swelling. Normal range of motion.     Cervical back: Neck supple.   Skin:    General: Skin is warm and dry.   Neurological:     General: No focal deficit present.     Mental Status: She is alert.   Psychiatric:        Mood and Affect: Mood normal.     ED Results / Procedures / Treatments   EKG None  Procedures OB Delivery  Date/Time: 12/12/2023 6:59 AM  Performed by: Roselyn Carlin NOVAK, MD Authorized by: Roselyn Carlin NOVAK, MD   Consent Done?:  Emergent Situation Risks, benefits and alternatives were discussed: not applicable   Delivery Summary For::  Mother and Baby Procedure  Details - Mother:    Amniotic sac:  Intact membrane   Amniotic fluid:  Clear   Shoulder dystocia:  None   Cord Complication::  None   Cord delayed clamping:: No     Child Living status::  Yes   Placenta Delivered::  No   Placenta Removal::  Not Done Prior to Transfer .Critical Care  Performed by: Roselyn Carlin NOVAK, MD Authorized by: Roselyn Carlin NOVAK, MD   Critical care provider statement:    Critical care time (minutes):  75   Critical care time was exclusive of:  Separately billable procedures and treating other patients   Critical care was time spent personally by me on the following activities:  Development of treatment plan with patient or surrogate, discussions with consultants, evaluation of patient's response to treatment, examination of patient, ordering and performing treatments and interventions, pulse  oximetry and re-evaluation of patient's condition   Care discussed with: admitting provider     Medications Ordered in the ED Medications  ondansetron  (ZOFRAN ) injection 4 mg (has no administration in time range)  oxytocin  (PITOCIN ) injection 10 Units (10 Units Intramuscular Given 12/12/23 0721)    Initial Impression and Plan  Mother arrives in precipitous labor without prenatal care. Delivered as a 1kg infant boy at 0604 hrs.  ED Course   Clinical Course as of 12/12/23 0730  Sun Dec 12, 2023  0716 Labs ordered. Spoke with Dr. Eileen, Labor and Delivery at [facility] where Baby is being transferred who will accept for transfer. She recommends IM Pitocin . Placenta has not delivered and no improvement with gentle cord traction.  [CS]  [id number] Care of the patient signed out to Dr. Albertina at shift change pending transport.  [CS]    Clinical Course User Index [CS] Roselyn Carlin NOVAK, MD     MDM Rules/Calculators/A&P Medical Decision Making Problems Addressed: Precipitous delivery: acute illness or injury Preterm labor with preterm delivery, single or unspecified fetus: acute illness or injury  Amount and/or Complexity of Data Reviewed Labs: ordered.  Risk Prescription drug management. Decision regarding hospitalization.     Final Clinical Impression(s) / ED Diagnoses Final diagnoses:  Precipitous delivery  Preterm labor with preterm delivery, single or unspecified fetus    Rx / DC Orders ED Discharge Orders     None        Roselyn Carlin NOVAK, MD 12/12/23 [phone]
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
## 2023-12-12 NOTE — ED Provider Notes (Signed)
 Physical Exam  BP (!) 123/98   Pulse 73   Temp 97.9 F (36.6 C) (Oral)   Resp (!) 22   Ht 5' 4 (1.626 m)   Wt 63 kg   LMP 05/29/2023   SpO2 98%   BMI 23.86 kg/m   Physical Exam Vitals and nursing note reviewed.  Constitutional:      General: She is in acute distress.     Appearance: She is well-developed.  HENT:     Head: Normocephalic and atraumatic.   Eyes:     Conjunctiva/sclera: Conjunctivae normal.   Pulmonary:     Effort: Pulmonary effort is normal. No respiratory distress.   Musculoskeletal:        General: No swelling.     Cervical back: Neck supple.   Skin:    General: Skin is warm and dry.     Capillary Refill: Capillary refill takes less than 2 seconds.   Neurological:     Mental Status: She is alert.   Psychiatric:        Mood and Affect: Mood normal.     Procedures  OB Delivery  Date/Time: 12/12/2023 7:42 AM  Performed by: Albertina Dixon, MD Authorized by: Albertina Dixon, MD   Consent Done?:  Emergent Situation Risks, benefits and alternatives were discussed: yes   Risks discussed:  All risks, numbness, pain and possible conversion to open surgery Alternatives discussed:  Alternative treatment, analgesia without sedation, delayed treatment and regional anesthesia Delivery Summary For::  Mother  Mother delivered infant prior to my arrival on shift.  Intramuscular Pitocin  given and I delivered the placenta with very gentle traction.  Placenta delivered successfully and placed in a Vacutainer for examination by OB providers .Critical Care  Performed by: Albertina Dixon, MD Authorized by: Albertina Dixon, MD   Critical care provider statement:    Critical care time (minutes):  30   Critical care was necessary to treat or prevent imminent or life-threatening deterioration of the following conditions: emergent delivery.   Critical care was time spent personally by me on the following activities:  Development of treatment plan with patient or  surrogate, discussions with consultants, evaluation of patient's response to treatment, examination of patient, ordering and review of laboratory studies, ordering and review of radiographic studies, ordering and performing treatments and interventions, pulse oximetry, re-evaluation of patient's condition and review of old charts   ED Course / MDM   Clinical Course as of 12/12/23 0742  Sun Dec 12, 2023  0716 Labs ordered. Spoke with Dr. Eileen, Labor and Delivery at [facility] where Baby is being transferred who will accept for transfer. She recommends IM Pitocin . Placenta has not delivered and no improvement with gentle cord traction.  [CS]  [id number] Care of the patient signed out to Dr. Albertina at shift change pending transport.  [CS]    Clinical Course User Index [CS] Brianna Carlin NOVAK, MD   Medical Decision Making Amount and/or Complexity of Data Reviewed Labs: ordered.  Risk Prescription drug management.   Patient received in handoff.  Precipitous delivery at 6:04 AM of a preterm infant of unknown age.  Placenta has not been delivered at time of signout.  Patient has a history of help syndrome and chronic hypertension.  Patient received IM Pitocin  by previous provider.  I was able to successfully deliver the placenta and it was placed in a Vacutainer for examination by [facility] providers.  I performed fundal massage and patient did not have significant postpartum hemorrhage.  Laboratory  evaluation with a white blood cell count of 30.1, hemoglobin 9.3, potassium 3.4, platelet count is normal, BUN/creatinine are normal.  HIV negative.  Patient with postdelivery nausea and cramping and she did receive some Zofran  and a small dose of fentanyl .  Patient started to have rising blood pressures with max blood pressure 160/90.  10 mg of labetalol  and magnesium were given.  I spoke with the OB/GYN on-call at [facility] to inform her that I administered these medications and patient will  be transferred emergently to [facility].       [place], [place], [place] 12/12/23 [phone]

## 2023-12-13 LAB — RPR: RPR Ser Ql: NONREACTIVE

## 2023-12-15 NOTE — Progress Notes (Signed)
   12/15/23 1000  Encounter Information  Time Initiated 0945  Time Spent 20  Encounter Type Follow up  Provider Type Intern  Contact With Patient;Family  Spiritual Context  Patient Behavioral/Emotional State Appropriate;Positive  Family Behavioral/Emotional State Appropriate;Positive  Spiritual Strengths Sense of connection  Spiritual/Religious Practices Prayer  Spiritual Encounter  Spiritual Issues/Themes Coping with life changes;Focusing on the present  Spiritual Outcomes Expressed acceptance;Distinguished between matters within/beyond control  Spiritual Interventions Explored understanding of situation;Provided supportive presence;Provided validation  Taxonomy  Intended Effects Demonstrate caring and concern;Build relationship of care and support;Establish rapport and connectedness  Methods Demonstrate acceptance;Offer support  Plan/Recommendations/Referral  Effects of Spiritual Beliefs/Practices on Medical Care and End-of-Life Decisions None identified  Plan Services complete. Consult for further needs.

## 2023-12-15 NOTE — Procedures (Signed)
-------------------------------------------------------------------------------   Attestation signed by Lauraine Ronnald Dural, MD at 12/16/2023  5:44 PM I provided general supervision for this procedure and was immediately available for any indicated intervention.  Lauraine Dural, MD, MPH Maternal-Fetal Medicine Attending  [facility]    -------------------------------------------------------------------------------  Nexplanon insertion PROCEDURE: The pt was educated about all her contraceptive options today and choose Nexplanon. She provided written consent today. UPT was not performed due to her being PPD 3. The insertion site was identified 10 cm proximal the left medial epidondyle and 3-5 cm posterior to the sulcus and the area was cleansed with betadine. The insertion site was anesthetized with 5ml of 1% lidocaine  with epi. A sterile Nexplanon applicator was removed from the packaging. The skin at the insertion site was stretched and the skin punctured at a 30 degree angle. The applicator was lowered to horizontal, while lifting the skin and the needle inserted to it's full length. The purple slider was pulled back until it stopped and the applicator removed. The presence of the implant was verified by palpation and the pt taught to palpate the rod. Steri strips were placed over the insertion site. A pressure bandage was applied. Patient aware Nexplanon is good for 3 years. Written consent scanned into the chart.   Exp: 11/2025 Lot: [id number]  Dr. Dural immediately available during procedure  Sharyne Dunks, MD PGY-1 Obstetrics & Gynecology 12/15/23 2:54 PM
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
## 2023-12-15 NOTE — Nursing Note (Addendum)
 Patient reports having a large bowel movement after miralax  given, 2 cups of prune juice (warmed) and stool softener. Declined suppository (still waiting to receive from pharmacy).  Nurse encouraged patient to drink (6-8) 8 oz glasses of water per day and also increase her intake of fruits and vegetables. Pt. Verbalized agreement.

## 2023-12-16 NOTE — Progress Notes (Signed)
 Case Management Discharge Note        CSN: [id number] DOB: 1991-05-31 Service: Obstetrics Location: 1076/01  Patient Class: Inpatient  DC Disposition: : Home or Self Care  Discharge DC Disposition: : Home or Self Care  Discharge Referrals Case closed, patient/family agree with disposition plan: Yes           Nolene Sharps, MSW
